# Patient Record
Sex: Female | Born: 1940
Health system: Southern US, Community
[De-identification: ages and names within clinical notes are randomized; demographics above are authoritative.]

## PROBLEM LIST (undated history)

## (undated) DIAGNOSIS — E119 Type 2 diabetes mellitus without complications: Secondary | ICD-10-CM

## (undated) DIAGNOSIS — K219 Gastro-esophageal reflux disease without esophagitis: Secondary | ICD-10-CM

## (undated) DIAGNOSIS — M1990 Unspecified osteoarthritis, unspecified site: Secondary | ICD-10-CM

## (undated) HISTORY — PX: TONSILLECTOMY: SUR1361

## (undated) HISTORY — PX: CATARACT EXTRACTION W/ INTRAOCULAR LENS IMPLANT: SHX1309

## (undated) HISTORY — PX: CHOLECYSTECTOMY: SHX55

## (undated) HISTORY — PX: ABDOMINAL HYSTERECTOMY: SHX81

## (undated) HISTORY — DX: Type 2 diabetes mellitus without complications: E11.9

---

## 2003-11-05 HISTORY — PX: COLONOSCOPY: SHX174

## 2003-12-19 ENCOUNTER — Ambulatory Visit (HOSPITAL_COMMUNITY): Admission: RE | Admit: 2003-12-19 | Discharge: 2003-12-19 | Payer: Self-pay | Admitting: Internal Medicine

## 2004-08-07 ENCOUNTER — Emergency Department (HOSPITAL_COMMUNITY): Admission: EM | Admit: 2004-08-07 | Discharge: 2004-08-07 | Payer: Self-pay | Admitting: Emergency Medicine

## 2006-07-25 ENCOUNTER — Ambulatory Visit (HOSPITAL_COMMUNITY): Admission: RE | Admit: 2006-07-25 | Discharge: 2006-07-25 | Payer: Self-pay | Admitting: Internal Medicine

## 2010-01-19 DIAGNOSIS — Z860100 Personal history of colon polyps, unspecified: Secondary | ICD-10-CM | POA: Insufficient documentation

## 2010-02-07 ENCOUNTER — Ambulatory Visit: Payer: Self-pay | Admitting: Internal Medicine

## 2010-02-07 DIAGNOSIS — K219 Gastro-esophageal reflux disease without esophagitis: Secondary | ICD-10-CM

## 2010-02-07 DIAGNOSIS — R1012 Left upper quadrant pain: Secondary | ICD-10-CM

## 2010-02-08 ENCOUNTER — Ambulatory Visit (HOSPITAL_COMMUNITY): Admission: RE | Admit: 2010-02-08 | Discharge: 2010-02-08 | Payer: Self-pay | Admitting: Family Medicine

## 2010-12-04 NOTE — Assessment & Plan Note (Signed)
Summary: e30,hx of polypsgu   Visit Type:  f/u Primary Care Provider:  [facility]  Chief Complaint:  needs tcs and hx of polyps.  History of Present Illness: Danielle Joseph is a pleasant 70 y/o WF who presents to schedule surveillance colonoscopy given h/o colonic polyps. She has had multiple colonscopies previously and has had numerous polyps. Dr. Jena Gauss performed last TCS in 2/05. She had left sided diverticula with long redundant colon, internal hemorroids, but no recurrent polyps. She has chronic intermittent diarrhea since her cholecystectomy. She self-treats with bran/fiber. Denies melena, brbpr. She has infrequent heartburn and takes ginger root for this. Denies dysphagia. She has occasional mild vague pain in the luq without obvious modifying or alleviating factors. She has had remoted EGD and says she has a hiatal hernia.  She has gained 10-15 pounds this winter.     Current Medications (verified): 1)  Fish Oil 1000 Mg Caps (Omega-3 Fatty Acids) .... Once Daily 2)  Glucosamine 500 Mg Caps (Glucosamine Sulfate) .... Once Daily 3)  Ginkoba 40 Mg Tabs (Ginkgo Biloba) .... Once Daily 4)  Odorless Garlic 500 Mg Tabs (Garlic) .... Once Daily 5)  B Complex .... Once Daily 6)  Folic Acid .... Once Daily 7)  Billberry .... Once Daily 8)  Green Tea .... Prn 9)  Ginger Root .... Once Daily  Allergies (verified): 1)  ! Penicillin  Past History:  Past Medical History: TCS, 2/05 --> Anal papilla and internal hemorrhoids otherwise normal rectum. Left sided diverticula with long capacious redundant colon. Diffusely pigmented colonic mucosa, left sided diverticula, no evidence for recurrent polyp or neoplasm.  Past Surgical History: Cholecystectomy Hysterectomy, partial Tonsillectomy  Family History: Maternal uncle, colon cancer.  No FH chronic GI illnesses, liver disease.  Social History: Divorced. 3 children. Never smoked. No alcohol, no drugs. Retired,  Designer, fashion/clothing.  Review of Systems General:  Denies fever, chills, sweats, anorexia, fatigue, weakness, malaise, and weight loss. Eyes:  Denies vision loss. ENT:  Denies nasal congestion, sore throat, hoarseness, and difficulty swallowing. CV:  Denies chest pains, angina, palpitations, dyspnea on exertion, and peripheral edema. Resp:  Denies dyspnea at rest, dyspnea with exercise, and cough. GI:  See HPI. GU:  Denies urinary burning and blood in urine. MS:  Denies joint pain / LOM. Derm:  Denies rash and itching. Neuro:  Denies weakness, paralysis, abnormal sensation, frequent headaches, difficulty walking, memory loss, and confusion. Psych:  Denies depression and anxiety. Endo:  Denies unusual weight change. Heme:  Denies bruising and bleeding. Allergy:  Denies hives and rash.  Vital Signs:  Patient profile:   70 year old female Height:      65 inches Weight:      203 pounds BMI:     33.90 Temp:     98.4 degrees F oral Pulse rate:   64 / minute BP sitting:   134 / 80  (left arm) Cuff size:   regular  Vitals Entered By: Hendricks Limes LPN (February 07, 453 10:52 AM)  Physical Exam  General:  Well developed, well nourished, no acute distress. Head:  Normocephalic and atraumatic. Eyes:  Conjunctivae pink, no scleral icterus.  Mouth:  Oropharyngeal mucosa moist, pink.  No lesions, erythema or exudate.    Neck:  Supple; no masses or thyromegaly. Lungs:  Clear throughout to auscultation. Heart:  Regular rate and rhythm; no murmurs, rubs,  or bruits. Abdomen:  Obese. Positive bowel sounds. NT. No HSM or masses. No abd bruit or hernia. Rectal:  deferred until time of colonoscopy.   Extremities:  No clubbing, cyanosis, edema or deformities noted. Neurologic:  Alert and  oriented x4;  grossly normal neurologically. Skin:  Intact without significant lesions or rashes. Cervical Nodes:  No significant cervical adenopathy. Psych:  Alert and cooperative. Normal mood and affect.  Impression &  Recommendations:  Problem # 1:  COLONIC POLYPS, HX OF (ICD-V12.72)  H/O colonic polyps on prior colonoscopies. Last one clear. FH of CRC, maternal unlce but no first degree relatives. Due for surveillance TCS. Colonoscopy to be performed in near future.  Risks, alternatives, and benefits including but not limited to the risk of reaction to medication, bleeding, infection, and perforation were addressed.  Patient voiced understanding and provided verbal consent.   Orders: Est. Patient Level IV (16109)  Problem # 2:  LUQ PAIN (ICD-789.02)  Very mild, nonspecific pain. Advised if became more persistent she should consider CT A/P with IV/oral contrast. She is not interested at this time. Also, with periodic GERD, offered her PPI and/or EGD but again she is not interested at this time. She will call with any further problems.  Orders: Est. Patient Level IV (60454)

## 2010-12-04 NOTE — Letter (Signed)
Summary: TCS ORDER  TCS ORDER   Imported By: Ave Filter 02/07/2010 14:34:47  _____________________________________________________________________  External Attachment:    Type:   Image     Comment:   External Document

## 2011-03-22 NOTE — Op Note (Signed)
NAME:  Danielle Joseph, Danielle Joseph                          ACCOUNT NO.:  [id number]   MEDICAL RECORD NO.:  [id number]                   PATIENT TYPE:  AMB   LOCATION:  DAY                                  FACILITY:  [facility]   PHYSICIAN:  R. Roetta Sessions, M.D.              DATE OF BIRTH:  1941/02/08   DATE OF PROCEDURE:  12/19/2003  DATE OF DISCHARGE:                                 OPERATIVE REPORT   PROCEDURE:  Surveillance colonoscopy.   INDICATIONS FOR PROCEDURE:  The patient is a 70 year old lady devoid of any  lower GI tract symptoms who has a history of colonic polyps with a last  colonoscopy some 7-8 years ago down in [place]. She does not recall  whether or not she had a polyp at that time. She is here for a surveillance  colonoscopy. This approach has been discussed with the patient the length.  The potential risks, benefits, and alternatives have been reviewed,  questions answered.  She is agreeable.  Please see the documentation in  medical records and my handwritten H&P.   MONITORING:  O2 saturation, blood pressure, pulse and respirations were  monitored throughout the entire procedure.   CONSCIOUS SEDATION:  Versed 4 mg IV, Demerol 50 mg IV in divided doses.   INSTRUMENT:  Olympus videochip pediatric colonoscope.   FINDINGS:  Digital rectal exam revealed no abnormalities.   ENDOSCOPIC FINDINGS:  The prep was adequate.   RECTUM:  Examination of the rectal mucosa including retroflexed view of the  anal verge revealed a couple of anal papilla and internal hemorrhoids  otherwise the rectal mucosa appeared normal.   COLON:  The colonic mucosa was surveyed from the rectosigmoid junction  through the left transverse right colon to the area of the appendiceal  orifice, ileocecal valve and cecum. These structures were seen and  photographed for the record. From this level, the scope was slowly and  cautiously withdrawn. All previously mentioned mucosal surfaces were again  seen. The  only abnormalities noted were left sided diverticula and diffusely  pigmented mucosa consistent with melanosis coli.  The colon was long,  redundant and capacious.  The patient tolerated the procedure well and was  reacted in endoscopy.   IMPRESSION:  1. Anal papilla and internal hemorrhoids otherwise normal rectum.  2. Left sided diverticula with long capacious redundant colon.  3. Diffusely pigmented colonic mucosa, left sided diverticula, no evidence     for recurrent polyp or neoplasm.   RECOMMENDATIONS:  1. Repeat colonoscopy in five years.  2. Diverticulosis literature provided to [name].      ___________________________________________                                            Jonathon Bellows, M.D.   RMR/MEDQ  D:  12/19/2003  T:  12/19/2003  Job:  [id number]   cc:   Patrica Duel, M.D.  [address]  [place]  [place] [address]  Fax: [phone]

## 2013-01-27 ENCOUNTER — Emergency Department (HOSPITAL_COMMUNITY): Payer: PRIVATE HEALTH INSURANCE

## 2013-01-27 ENCOUNTER — Encounter (HOSPITAL_COMMUNITY): Payer: Self-pay | Admitting: *Deleted

## 2013-01-27 ENCOUNTER — Emergency Department (HOSPITAL_COMMUNITY)
Admission: EM | Admit: 2013-01-27 | Discharge: 2013-01-27 | Disposition: A | Discharge status: Home / Self Care | Payer: PRIVATE HEALTH INSURANCE | Attending: Emergency Medicine | Admitting: Emergency Medicine

## 2013-01-27 DIAGNOSIS — W208XXA Other cause of strike by thrown, projected or falling object, initial encounter: Secondary | ICD-10-CM | POA: Insufficient documentation

## 2013-01-27 DIAGNOSIS — Y9389 Activity, other specified: Secondary | ICD-10-CM | POA: Insufficient documentation

## 2013-01-27 DIAGNOSIS — Y929 Unspecified place or not applicable: Secondary | ICD-10-CM | POA: Insufficient documentation

## 2013-01-27 DIAGNOSIS — S91109A Unspecified open wound of unspecified toe(s) without damage to nail, initial encounter: Secondary | ICD-10-CM | POA: Insufficient documentation

## 2013-01-27 DIAGNOSIS — Z23 Encounter for immunization: Secondary | ICD-10-CM | POA: Insufficient documentation

## 2013-01-27 DIAGNOSIS — S91209A Unspecified open wound of unspecified toe(s) with damage to nail, initial encounter: Secondary | ICD-10-CM

## 2013-01-27 MED ORDER — TETANUS-DIPHTH-ACELL PERTUSSIS 5-2.5-18.5 LF-MCG/0.5 IM SUSP
0.5000 mL | Freq: Once | INTRAMUSCULAR | Status: AC
  Administered 2013-01-27: 0.5 mL via INTRAMUSCULAR
  Filled 2013-01-27: qty 0.5

## 2013-01-27 MED ORDER — HYDROCODONE-ACETAMINOPHEN 5-325 MG PO TABS: 1.0000 | ORAL_TABLET | ORAL | Status: DC | PRN

## 2013-01-27 MED ORDER — LIDOCAINE HCL (PF) 2 % IJ SOLN
INTRAMUSCULAR | Status: AC
  Administered 2013-01-27: 20:00:00
  Filled 2013-01-27: qty 10

## 2013-01-27 NOTE — ED Notes (Signed)
feft great toe injury, dropped a piece of wood on foot

## 2013-01-27 NOTE — ED Notes (Addendum)
Pt c/o left great toe pain that began this afternoon after dropping a piece of wood on the toe. Toenail is hanging off of toe. Bleeding controlled. Pt is able to move toe.

## 2013-02-01 ENCOUNTER — Encounter: Payer: Self-pay | Admitting: Orthopedic Surgery

## 2013-02-01 ENCOUNTER — Ambulatory Visit (INDEPENDENT_AMBULATORY_CARE_PROVIDER_SITE_OTHER): Payer: PRIVATE HEALTH INSURANCE | Admitting: Orthopedic Surgery

## 2013-02-01 VITALS — BP 132/84 | Ht 65.0 in | Wt 180.0 lb

## 2013-02-01 DIAGNOSIS — S91109A Unspecified open wound of unspecified toe(s) without damage to nail, initial encounter: Secondary | ICD-10-CM

## 2013-02-01 DIAGNOSIS — S91209A Unspecified open wound of unspecified toe(s) with damage to nail, initial encounter: Secondary | ICD-10-CM

## 2013-02-01 DIAGNOSIS — S91209S Unspecified open wound of unspecified toe(s) with damage to nail, sequela: Secondary | ICD-10-CM

## 2013-02-01 DIAGNOSIS — IMO0002 Reserved for concepts with insufficient information to code with codable children: Secondary | ICD-10-CM

## 2013-02-01 NOTE — Patient Instructions (Addendum)
Keep clean and dry   Come in  In 1 week to change dressing

## 2013-02-01 NOTE — ED Provider Notes (Signed)
History     CSN: [id number]  Arrival date & time 01/27/13  1802   First MD Initiated Contact with Patient 01/27/13 1826      Chief Complaint  Patient presents with  . Toe Injury    (Consider location/radiation/quality/duration/timing/severity/associated sxs/prior treatment) HPI Comments: Danielle Joseph is a 72 y.o. Female presenting with pain, swelling, bleeding and nail avulsion since dropping a piece of wood on her left great toe 2 hours prior to arrival.  Pain is constant,  Throbbing and worse with palpation, better at rest and with elevation of the foot.  She has taken no medications prior to arrival for her pain.  She is unsure of her tetanus status.     The history is provided by the patient.    History reviewed. No pertinent past medical history.  Past Surgical History  Procedure Laterality Date  . Cholecystectomy    . Abdominal hysterectomy    . Tonsillectomy      Family History  Problem Relation Age of Onset  . Heart disease    . Arthritis    . Diabetes      History  Substance Use Topics  . Smoking status: Never Smoker   . Smokeless tobacco: Not on file  . Alcohol Use: No    OB History   Grav Para Term Preterm Abortions TAB SAB Ect Mult Living                  Review of Systems  Constitutional: Negative for fever and chills.  HENT: Negative.  Negative for sore throat.   Eyes: Negative.   Respiratory: Negative for chest tightness.   Cardiovascular: Negative for chest pain.  Gastrointestinal: Negative for nausea and abdominal pain.  Musculoskeletal: Positive for arthralgias.  Skin: Positive for wound. Negative for rash.  Neurological: Negative for dizziness, weakness, light-headedness, numbness and headaches.  Psychiatric/Behavioral: Negative.     Allergies  Penicillins  Home Medications   Current Outpatient Rx  Name  Route  Sig  Dispense  Refill  . HYDROcodone-acetaminophen (NORCO/VICODIN) 5-325 MG per tablet   Oral   Take 1 tablet by  mouth every 4 (four) hours as needed for pain.   20 tablet   0     BP 133/76  Pulse 92  Temp(Src) 98 F (36.7 C) (Oral)  Resp 18  Ht 5\' 5"  (1.651 m)  Wt 180 lb (81.647 kg)  BMI 29.95 kg/m2  SpO2 96%  Physical Exam  Constitutional: She appears well-developed and well-nourished.  HENT:  Head: Atraumatic.  Neck: Normal range of motion.  Cardiovascular:  Pulses equal bilaterally  Musculoskeletal: She exhibits tenderness.  Neurological: She is alert. She has normal strength. She displays normal reflexes. No sensory deficit.  Equal strength  Skin: Skin is warm and dry.  Left great toe nail plate is attached along medial cuticle only. The nail bed is hemostatic.  Less than 3 sec cap refill.  Patient with normal sensation distal toe.  Modest edema present.  Psychiatric: She has a normal mood and affect.    ED Course  NAIL REMOVAL Date/Time: 01/27/2013 7:50 PM Performed by: Burgess Amor Authorized by: Burgess Amor Consent: Verbal consent obtained. Risks and benefits: risks, benefits and alternatives were discussed Consent given by: patient Patient identity confirmed: verbally with patient Time out: Immediately prior to procedure a "time out" was called to verify the correct patient, procedure, equipment, support staff and site/side marked as required. Location: left foot Location details: left big toe Anesthesia: digital  block Local anesthetic: lidocaine 2% without epinephrine Anesthetic total: 2 ml Preparation: skin prepped with Betadine Amount removed: complete Nail bed sutured: yes Suture material: 4-0 Chromic gut Number of sutures: 2 Removed nail replaced and anchored: no Dressing: Xeroform gauze and dressing applied Patient tolerance: Patient tolerated the procedure well with no immediate complications. Comments: Nail bed laceration irregular,  Was able to approximate with lateral tacking sutures.   (including critical care time)  Labs Reviewed - No data to  display No results found.   1. Toenail avulsion, initial encounter       MDM  Patients labs and/or radiological studies were viewed and considered during the medical decision making and disposition process.  Tetanus updated.  Pt prescribed hydrocodone,  Encouraged ice, elevation for the next several days.  Referral to ortho for recheck of injury this week.  (No ortho coverage today,  So both orthopedists names given).  Pt understands she may have permanent problems as the nail plate grows out if the nail bed does not heal completely and she may need revision by ortho depending on how the wound looks over the next several days. No fracture per xray.       Burgess Amor, PA-C 02/01/13 1757

## 2013-02-01 NOTE — Progress Notes (Signed)
Patient ID: Danielle Joseph, female   DOB: [birth year], 72 y.o.   MRN: [id number] Chief Complaint  Patient presents with  . Toe Injury    Left great toe injury, DOI 01-27-13.    This patient dropped a piece of wood on her left great toe on 01/27/2013 the toe was injured and not fractured x-rays were negative the toe was removed the nailbed was repaired with resorbable suture she comes in for her first visit he complains of some throbbing pain 3/10  She has a history of some heartburn and diarrhea on review of systems along with some seasonal allergies  On exam she is well-developed well-nourished grooming and hygiene are normal BP 132/84  Ht 5\' 5"  (1.651 m)  Wt 180 lb (81.647 kg)  BMI 29.95 kg/m2  The toe looks clean the nailbed looks clean good repair is noted Good capillary refill is noted  Patient is oriented x3 mood is normal ambulation is normal in the foot flop  We redressed the wound and wanted to keep it clean and dry placed in a postop shoe followup in a week change dressing to a Band-Aid as long as there is no infection

## 2013-02-08 NOTE — ED Provider Notes (Signed)
Medical screening examination/treatment/procedure(s) were performed by non-physician practitioner and as supervising physician I was immediately available for consultation/collaboration.  Donnetta Hutching, MD 02/08/13 1208

## 2013-02-09 ENCOUNTER — Ambulatory Visit: Payer: PRIVATE HEALTH INSURANCE | Admitting: Orthopedic Surgery

## 2013-02-11 ENCOUNTER — Ambulatory Visit (INDEPENDENT_AMBULATORY_CARE_PROVIDER_SITE_OTHER): Payer: PRIVATE HEALTH INSURANCE | Admitting: Orthopedic Surgery

## 2013-02-11 ENCOUNTER — Encounter: Payer: Self-pay | Admitting: Orthopedic Surgery

## 2013-02-11 VITALS — BP 140/82 | Ht 65.0 in | Wt 180.0 lb

## 2013-02-11 DIAGNOSIS — Z5189 Encounter for other specified aftercare: Secondary | ICD-10-CM

## 2013-02-11 DIAGNOSIS — S91209D Unspecified open wound of unspecified toe(s) with damage to nail, subsequent encounter: Secondary | ICD-10-CM

## 2013-02-11 NOTE — Progress Notes (Signed)
Patient ID: Danielle Joseph, female   DOB: 1941-04-18, 72 y.o.   MRN: [id number] Chief Complaint  Patient presents with  . Follow-up    dressing change left great toe DOI 01/27/13    Left great toenail avulsion now repaired in ER with nonabsorbable suture dressing change today after Xeroform dressing for the last week toenail bed looks clean repair looks good and recommend dressing change for a few more days then allow the nail to grow back. She is concerned about her $30 co-pay so onset of a 3 month followup she will come back if things aren't going well

## 2013-02-11 NOTE — Patient Instructions (Signed)
Remove band aid Monday You can take a shower now

## 2015-01-30 IMAGING — CR DG FOOT COMPLETE 3+V*L*
3 series · 3 of 3 positions shown · non-contrast
Comparison: None

CLINICAL DATA: Injured left foot.

LEFT FOOT - COMPLETE 3+ VIEW

[view not recorded (1 of 3)]
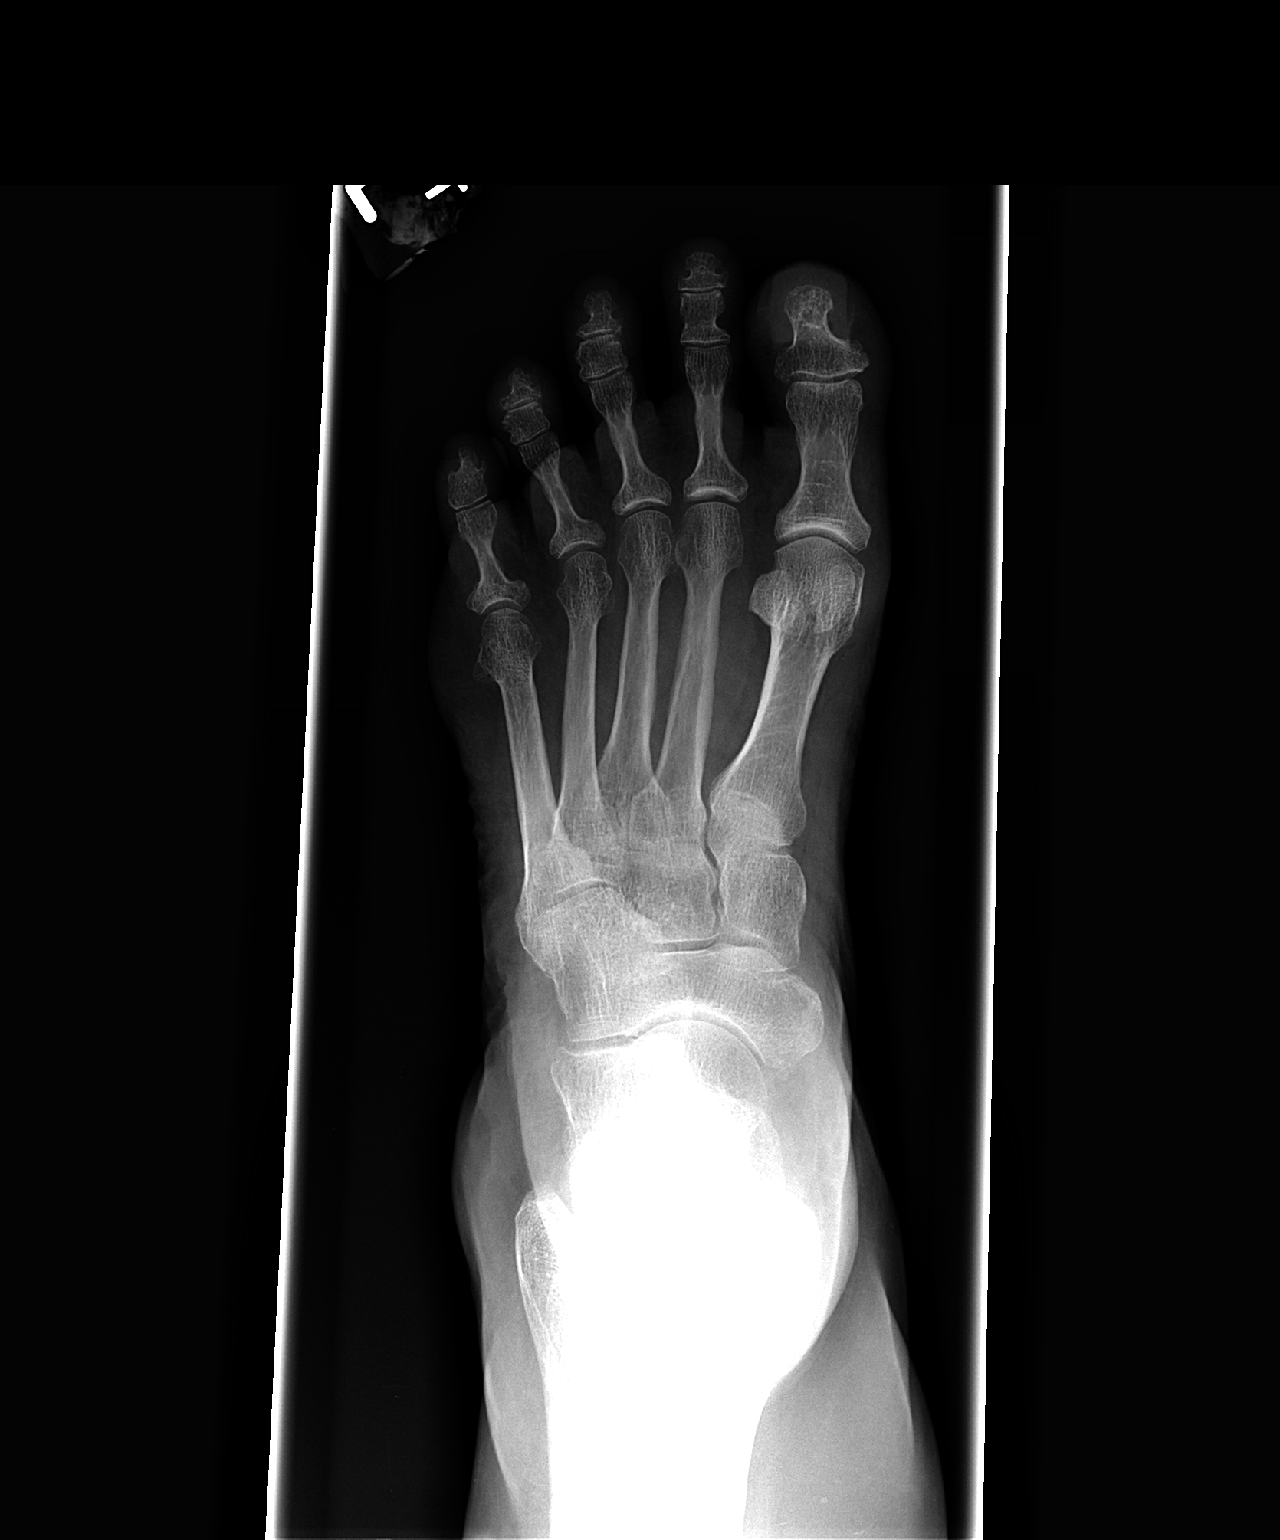

[view not recorded (2 of 3)]
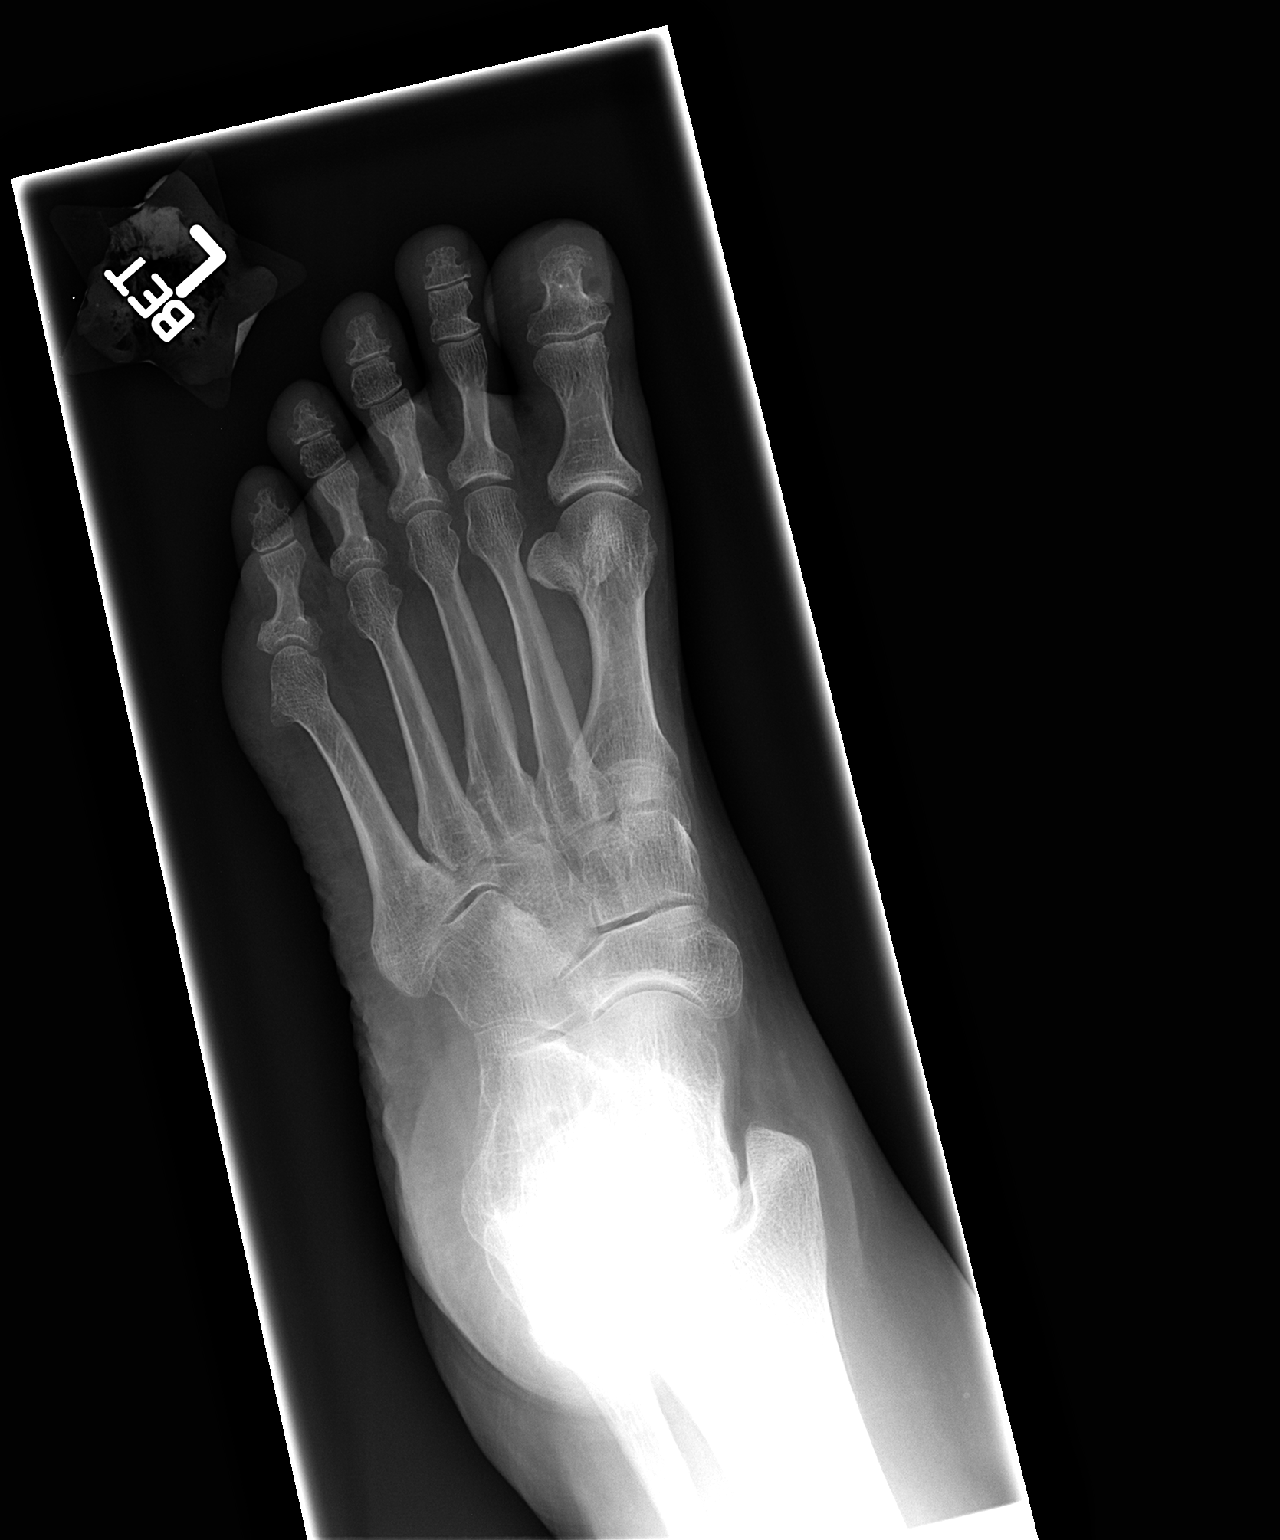

[view not recorded (3 of 3)]
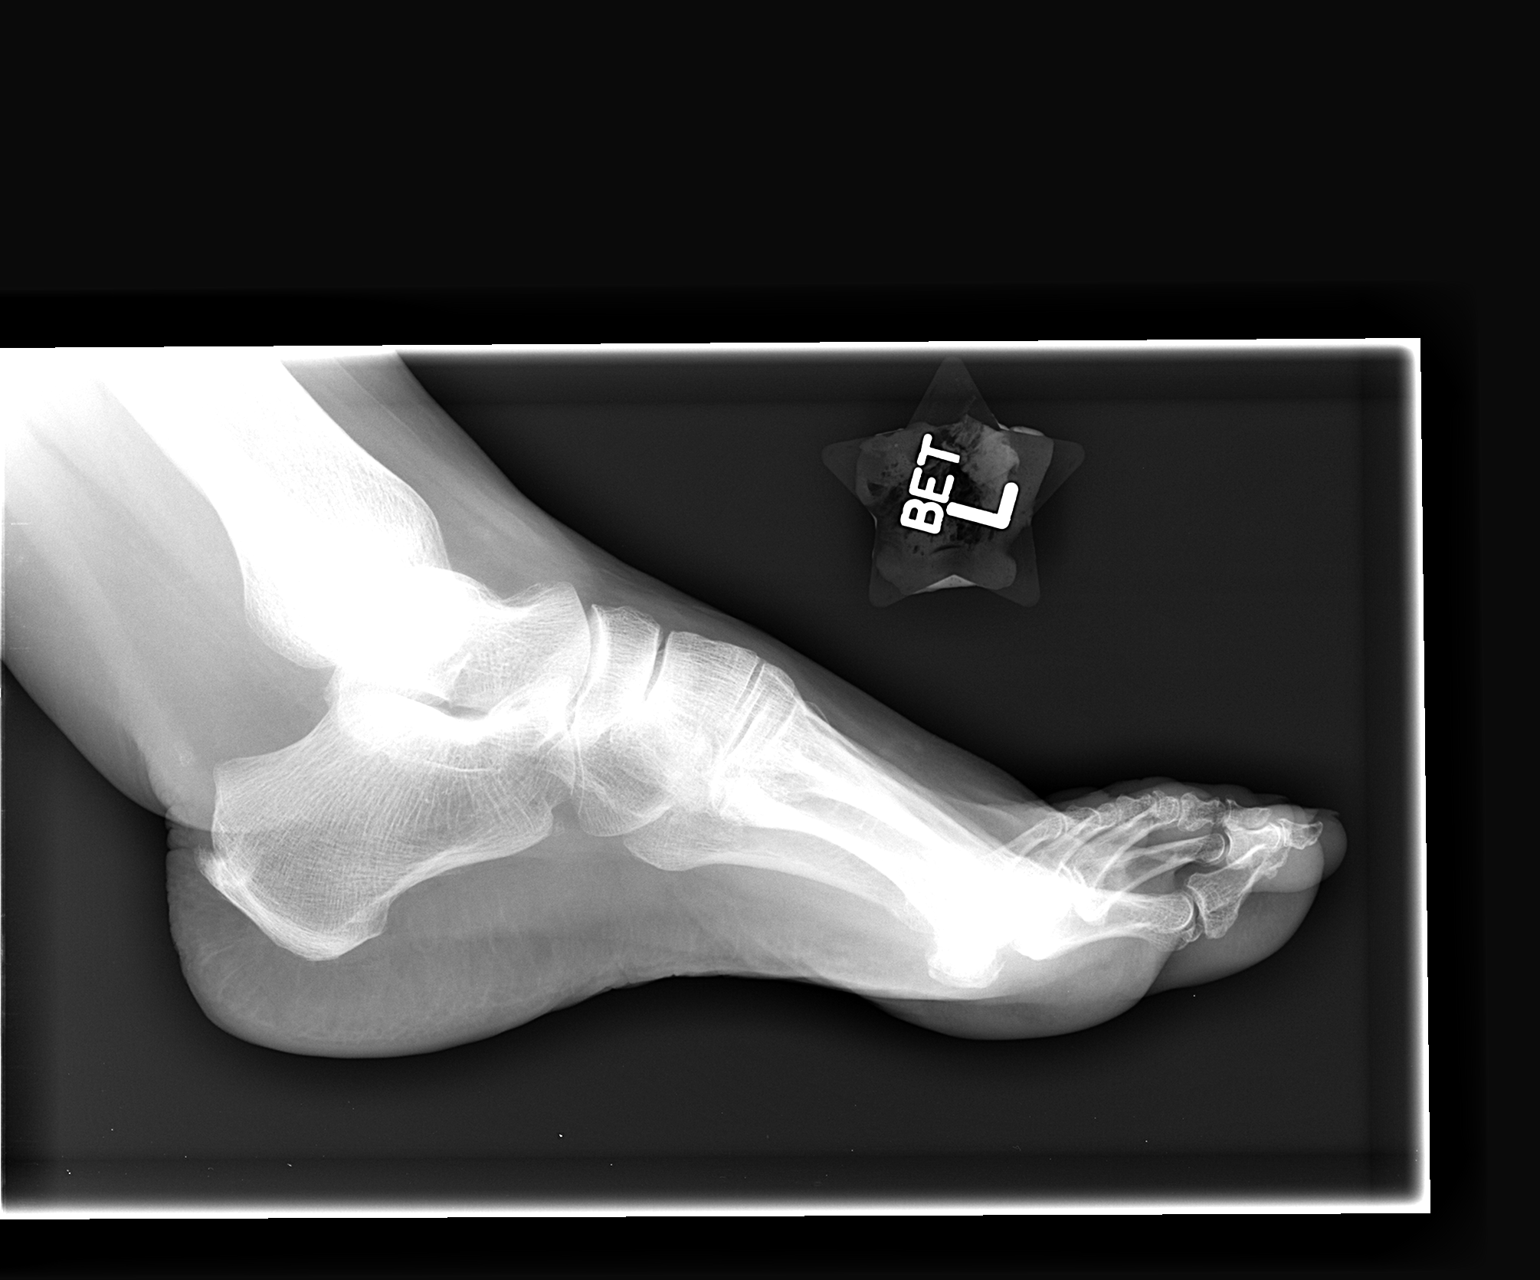

[3 of 3 positions shown; findings below may reference images not displayed]

FINDINGS: The joint spaces are maintained.  No acute fracture.
IMPRESSION: No acute bony findings.

## 2016-07-31 DIAGNOSIS — H40033 Anatomical narrow angle, bilateral: Secondary | ICD-10-CM | POA: Diagnosis not present

## 2016-07-31 DIAGNOSIS — H2513 Age-related nuclear cataract, bilateral: Secondary | ICD-10-CM | POA: Diagnosis not present

## 2016-08-13 DIAGNOSIS — H02831 Dermatochalasis of right upper eyelid: Secondary | ICD-10-CM | POA: Diagnosis not present

## 2016-08-13 DIAGNOSIS — H1851 Endothelial corneal dystrophy: Secondary | ICD-10-CM | POA: Diagnosis not present

## 2016-08-13 DIAGNOSIS — H25812 Combined forms of age-related cataract, left eye: Secondary | ICD-10-CM | POA: Diagnosis not present

## 2016-08-13 DIAGNOSIS — H25811 Combined forms of age-related cataract, right eye: Secondary | ICD-10-CM | POA: Diagnosis not present

## 2016-09-05 DIAGNOSIS — H25811 Combined forms of age-related cataract, right eye: Secondary | ICD-10-CM | POA: Diagnosis not present

## 2016-09-05 DIAGNOSIS — H2511 Age-related nuclear cataract, right eye: Secondary | ICD-10-CM | POA: Diagnosis not present

## 2016-09-06 DIAGNOSIS — H59021 Cataract (lens) fragments in eye following cataract surgery, right eye: Secondary | ICD-10-CM | POA: Diagnosis not present

## 2016-09-06 DIAGNOSIS — H43813 Vitreous degeneration, bilateral: Secondary | ICD-10-CM | POA: Diagnosis not present

## 2016-09-09 DIAGNOSIS — H59021 Cataract (lens) fragments in eye following cataract surgery, right eye: Secondary | ICD-10-CM | POA: Diagnosis not present

## 2017-06-18 ENCOUNTER — Emergency Department (HOSPITAL_COMMUNITY)
Admission: EM | Admit: 2017-06-18 | Discharge: 2017-06-18 | Disposition: A | Discharge status: Home / Self Care | Payer: PPO | Attending: Emergency Medicine | Admitting: Emergency Medicine

## 2017-06-18 ENCOUNTER — Encounter (HOSPITAL_COMMUNITY): Payer: Self-pay | Admitting: Cardiology

## 2017-06-18 DIAGNOSIS — R739 Hyperglycemia, unspecified: Secondary | ICD-10-CM | POA: Diagnosis not present

## 2017-06-18 DIAGNOSIS — E1165 Type 2 diabetes mellitus with hyperglycemia: Secondary | ICD-10-CM | POA: Diagnosis not present

## 2017-06-18 DIAGNOSIS — R42 Dizziness and giddiness: Secondary | ICD-10-CM | POA: Insufficient documentation

## 2017-06-18 LAB — URINALYSIS, ROUTINE W REFLEX MICROSCOPIC
Bilirubin Urine: NEGATIVE
Glucose, UA: >=500 mg/dL — AB
Hgb urine dipstick: NEGATIVE
Ketones, ur: NEGATIVE mg/dL
Nitrite: NEGATIVE
Protein, ur: NEGATIVE mg/dL
Specific Gravity, Urine: 1.028 (ref 1.005–1.030)
pH: 5 (ref 5.0–8.0)

## 2017-06-18 LAB — CBG MONITORING, ED: Glucose-Capillary: 293 mg/dL — ABNORMAL HIGH (ref 65–99)

## 2017-06-18 MED ORDER — MECLIZINE HCL 12.5 MG PO TABS: 12.5000 mg | ORAL_TABLET | Freq: Three times a day (TID) | ORAL | 0 refills | Status: DC | PRN

## 2017-06-18 MED ORDER — MECLIZINE HCL 12.5 MG PO TABS
12.5000 mg | ORAL_TABLET | Freq: Once | ORAL | Status: AC
  Administered 2017-06-18: 12.5 mg via ORAL
  Filled 2017-06-18: qty 1

## 2017-06-18 NOTE — ED Provider Notes (Signed)
[facility]-EMERGENCY DEPT Provider Note   CSN: [id number] Arrival date & time: 06/18/17  1022     History   Chief Complaint Chief Complaint  Patient presents with  . Dizziness    HPI Danielle Joseph is a 76 y.o. female.  HPI Patient presents with dizziness. Began yesterday. Starts with the ringing inner ears no fullness. Began on right side and on both sides. States she's feels unsteady with it. States she's had some slight difficulty hearing with it. No vision changes. Minimal headache but feels more fullness in the ears. No other localizing numbness or weakness. No confusion. No difficulty speaking. Has had some ear problems in the past but no history of vertigo. No vision changes. History reviewed. No pertinent past medical history.  Patient Active Problem List   Diagnosis Date Noted  . GERD 02/07/2010  . LUQ PAIN 02/07/2010  . COLONIC POLYPS, HX OF 01/19/2010    Past Surgical History:  Procedure Laterality Date  . ABDOMINAL HYSTERECTOMY    . CHOLECYSTECTOMY    . TONSILLECTOMY      OB History    No data available       Home Medications    Prior to Admission medications   Medication Sig Start Date End Date Taking? Authorizing Provider  HYDROcodone-acetaminophen (NORCO/VICODIN) 5-325 MG per tablet Take 1 tablet by mouth every 4 (four) hours as needed for pain. 01/27/13   Burgess AmorIdol, Julie, PA-C  meclizine (ANTIVERT) 12.5 MG tablet Take 1 tablet (12.5 mg total) by mouth 3 (three) times daily as needed for dizziness. 06/18/17   Benjiman CorePickering, Dante Cooter, MD    Family History Family History  Problem Relation Age of Onset  . Heart disease Unknown   . Arthritis Unknown   . Diabetes Unknown     Social History Social History  Substance Use Topics  . Smoking status: Never Smoker  . Smokeless tobacco: Not on file  . Alcohol use No     Allergies   Penicillins   Review of Systems Review of Systems  Constitutional: Negative for appetite change.  HENT: Positive for tinnitus.     Respiratory: Negative for chest tightness.   Cardiovascular: Negative for chest pain.  Gastrointestinal: Negative for abdominal pain.  Genitourinary: Negative for dysuria.       Foul-smelling urine  Musculoskeletal: Negative for back pain.  Neurological: Positive for dizziness and headaches.     Physical Exam Updated Vital Signs BP 135/65   Pulse (!) 58   Temp 98 F (36.7 C) (Oral)   Resp 17   SpO2 98%   Physical Exam  Constitutional: She is oriented to person, place, and time. She appears well-developed.  HENT:  Head: Atraumatic.  Eyes:  Eye movements intact. Nystagmus with gaze to left.  Neck: Neck supple.  Cardiovascular: Normal rate.   Pulmonary/Chest: No respiratory distress.  Abdominal: Soft.  Musculoskeletal: She exhibits no edema.  Neurological: She is alert and oriented to person, place, and time.  Finger-nose intact bilaterally. Good grips bilaterally. Face symmetric. Some nystagmus with gaze to left. Heel shin intact bilaterally. Felt a little dizzy going to sitting but was able to stand without dizziness. Also able ambulated.  Skin: Skin is warm. Capillary refill takes less than 2 seconds.  Psychiatric: She has a normal mood and affect.     ED Treatments / Results  Labs (all labs ordered are listed, but only abnormal results are displayed) Labs Reviewed  URINALYSIS, ROUTINE W REFLEX MICROSCOPIC - Abnormal; Notable for the following:  Result Value   APPearance HAZY (*)    Glucose, UA >=500 (*)    Leukocytes, UA MODERATE (*)    Bacteria, UA RARE (*)    Squamous Epithelial / LPF 6-30 (*)    Non Squamous Epithelial 0-5 (*)    All other components within normal limits  CBG MONITORING, ED - Abnormal; Notable for the following:    Glucose-Capillary 293 (*)    All other components within normal limits  URINE CULTURE    EKG  EKG Interpretation  Date/Time:  Wednesday June 18 2017 10:35:22 EDT Ventricular Rate:  64 PR Interval:    QRS  Duration: 109 QT Interval:  421 QTC Calculation: 435 R Axis:   98 Text Interpretation:  Sinus rhythm Consider left atrial enlargement Right axis deviation Probable anteroseptal infarct, old No old tracing to compare Confirmed by Benjiman Core [id number]) on 06/18/2017 10:42:56 AM       Radiology No results found.  Procedures Procedures (including critical care time)  Medications Ordered in ED Medications  meclizine (ANTIVERT) tablet 12.5 mg (12.5 mg Oral Given 06/18/17 1105)     Initial Impression / Assessment and Plan / ED Course  I have reviewed the triage vital signs and the nursing notes.  Pertinent labs & imaging results that were available during my care of the patient were reviewed by me and considered in my medical decision making (see chart for details).     Patient presents with vertigo. Appears to be peripheral. Has tinnitus with it. Nonfocal exam otherwise except for some mild nystagmus. Feels somewhat better after Antivert. I doubt this is a central cause. Mild hyperglycemia. Sugar of under 300. Has "Sugar diabetes" does not take her metformin. Is not willing to take it. No ketones in urine. Has follow-up appointment with her PCP. Will discharge home. Patient was instructed on the need for medical compliance.  Final Clinical Impressions(s) / ED Diagnoses   Final diagnoses:  Vertigo  Hyperglycemia    New Prescriptions New Prescriptions   MECLIZINE (ANTIVERT) 12.5 MG TABLET    Take 1 tablet (12.5 mg total) by mouth 3 (three) times daily as needed for dizziness.     Benjiman Core, MD 06/18/17 1304

## 2017-06-18 NOTE — Discharge Instructions (Signed)
Follow-up through Dr. to get your sugar under control. And further management of your vertigo with needed.

## 2017-06-18 NOTE — ED Triage Notes (Signed)
Dizziness and ear fullness, headache since yesterday morning.

## 2017-06-20 LAB — URINE CULTURE

## 2017-07-10 DIAGNOSIS — R81 Glycosuria: Secondary | ICD-10-CM | POA: Diagnosis not present

## 2017-07-10 DIAGNOSIS — Z6826 Body mass index (BMI) 26.0-26.9, adult: Secondary | ICD-10-CM | POA: Diagnosis not present

## 2017-07-10 DIAGNOSIS — N390 Urinary tract infection, site not specified: Secondary | ICD-10-CM | POA: Diagnosis not present

## 2017-07-10 DIAGNOSIS — H8149 Vertigo of central origin, unspecified ear: Secondary | ICD-10-CM | POA: Diagnosis not present

## 2017-07-11 DIAGNOSIS — I10 Essential (primary) hypertension: Secondary | ICD-10-CM | POA: Diagnosis not present

## 2017-07-11 DIAGNOSIS — E748 Other specified disorders of carbohydrate metabolism: Secondary | ICD-10-CM | POA: Diagnosis not present

## 2017-07-17 DIAGNOSIS — Z6826 Body mass index (BMI) 26.0-26.9, adult: Secondary | ICD-10-CM | POA: Diagnosis not present

## 2017-07-17 DIAGNOSIS — E119 Type 2 diabetes mellitus without complications: Secondary | ICD-10-CM | POA: Diagnosis not present

## 2017-07-30 DIAGNOSIS — H40033 Anatomical narrow angle, bilateral: Secondary | ICD-10-CM | POA: Diagnosis not present

## 2017-07-30 DIAGNOSIS — E119 Type 2 diabetes mellitus without complications: Secondary | ICD-10-CM | POA: Diagnosis not present

## 2017-10-06 DIAGNOSIS — E119 Type 2 diabetes mellitus without complications: Secondary | ICD-10-CM | POA: Diagnosis not present

## 2017-10-06 DIAGNOSIS — E782 Mixed hyperlipidemia: Secondary | ICD-10-CM | POA: Diagnosis not present

## 2017-10-09 DIAGNOSIS — R945 Abnormal results of liver function studies: Secondary | ICD-10-CM | POA: Diagnosis not present

## 2017-10-09 DIAGNOSIS — E119 Type 2 diabetes mellitus without complications: Secondary | ICD-10-CM | POA: Diagnosis not present

## 2017-10-09 DIAGNOSIS — Z86010 Personal history of colonic polyps: Secondary | ICD-10-CM | POA: Diagnosis not present

## 2017-10-09 DIAGNOSIS — Z6826 Body mass index (BMI) 26.0-26.9, adult: Secondary | ICD-10-CM | POA: Diagnosis not present

## 2017-10-09 DIAGNOSIS — E782 Mixed hyperlipidemia: Secondary | ICD-10-CM | POA: Diagnosis not present

## 2017-10-09 DIAGNOSIS — H259 Unspecified age-related cataract: Secondary | ICD-10-CM | POA: Diagnosis not present

## 2017-10-21 ENCOUNTER — Encounter: Payer: Self-pay | Admitting: Internal Medicine

## 2017-12-05 ENCOUNTER — Ambulatory Visit: Payer: PPO | Admitting: Gastroenterology

## 2017-12-05 ENCOUNTER — Encounter: Payer: Self-pay | Admitting: Gastroenterology

## 2017-12-05 ENCOUNTER — Other Ambulatory Visit: Payer: Self-pay

## 2017-12-05 VITALS — BP 125/80 | HR 84 | Temp 96.9°F | Ht 65.0 in | Wt 168.2 lb

## 2017-12-05 DIAGNOSIS — K529 Noninfective gastroenteritis and colitis, unspecified: Secondary | ICD-10-CM | POA: Diagnosis not present

## 2017-12-05 DIAGNOSIS — Z86010 Personal history of colonic polyps: Secondary | ICD-10-CM | POA: Diagnosis not present

## 2017-12-05 MED ORDER — PEG 3350-KCL-NA BICARB-NACL 420 G PO SOLR: 4000.0000 mL | ORAL | 0 refills | Status: DC

## 2017-12-05 NOTE — Patient Instructions (Addendum)
We have scheduled you for a colonoscopy with Dr. Jena Gauss in the near future.  Since you are dealing with alternating bowel habits, I recommend keeping a food/stool diary to see what foods "trigger" you. I would avoid milk products for now. See what happens when you add a small dose of fiber daily like benefiber or metamucil.    Lactose-Free Diet, Adult If you have lactose intolerance, you are not able to digest lactose. Lactose is a natural sugar found mainly in milk and milk products. You may need to avoid all foods and beverages that contain lactose. A lactose-free diet can help you do this. What do I need to know about this diet?  Do not consume foods, beverages, vitamins, minerals, or medicines with lactose. Read ingredients lists carefully.  Look for the words "lactose-free" on labels.  Use lactase enzyme drops or tablets as directed by your health care provider.  Use lactose-free milk or a milk alternative, such as soy milk, for drinking and cooking.  Make sure you get enough calcium and vitamin D in your diet. A lactose-free eating plan can be lacking in these important nutrients.  Take calcium and vitamin D supplements as directed by your health care provider. Talk to your provider about supplements if you are not able to get enough calcium and vitamin D from food. Which foods have lactose? Lactose is found in:  Milk and foods made from milk.  Yogurt.  Cheese.  Butter.  Margarine.  Sour cream.  Cream.  Whipped toppings and nondairy creamers.  Ice cream and other milk-based desserts.  Lactose is also found in foods or products made with milk or milk ingredients. To find out whether a food contains milk or a milk ingredient, look at the ingredients list. Avoid foods with the statement "May contain milk" and foods that contain:  Butter.  Cream.  Milk.  Milk solids.  Milk powder.  Whey.  Curd.  Caseinate.  Lactose.  Lactalbumin.  Lactoglobulin.  What  are some alternatives to milk and foods made with milk products?  Lactose-free milk.  Soy milk with added calcium and vitamin D.  Almond, coconut, or rice milk with added calcium and vitamin D. Note that these are low in protein.  Soy products, such as soy yogurt, soy cheese, soy ice cream, and soy-based sour cream. Which foods can I eat? Grains Breads and rolls made without milk, such as [place], [place], or [place] bread, bagels, pita, and [employer]. Corn tortillas, corn meal, grits, and polenta. Crackers without lactose or milk solids, such as soda crackers and graham crackers. Cooked or dry cereals without lactose or milk solids. Pasta, quinoa, couscous, barley, oats, bulgur, farro, rice, wild rice, or other grains prepared without milk or lactose. Plain popcorn. Vegetables Fresh, frozen, and canned vegetables without cheese, cream, or butter sauces. Fruits All fresh, canned, frozen, or dried fruits that are not processed with lactose. Meats and Other Protein Sources Plain beef, chicken, fish, [place], lamb, veal, pork, wild game, or ham. Kosher-prepared meat products. Strained or junior meats that do not contain milk. Eggs. Soy meat substitutes. Beans, lentils, and hummus. Tofu. Nuts and seeds. Peanut or other nut butters without lactose. Soups, casseroles, and mixed dishes without cheese, cream, or milk. Dairy Lactose-free milk. Soy, rice, or almond milk with added calcium and vitamin D. Soy cheese and yogurt. Beverages Carbonated drinks. Tea. Coffee, freeze-dried coffee, and some instant coffees. Fruit and vegetable juices. Condiments Soy sauce. Carob powder. Olives. Gravy made with water. Baker's cocoa.  Rosita FirePickles. Pure seasonings and spices. Ketchup. Mustard. Bouillon. Broth. Sweets and Desserts Water and fruit ices. Gelatin. Cookies, pies, or cakes made from allowed ingredients, such as angel food cake. Pudding made with water or a milk substitute. Lactose-free tofu desserts. Soy, coconut  milk, or rice-milk-based frozen desserts. Sugar. Honey. Jam, jelly, and marmalade. Molasses. Pure sugar candy. Dark chocolate without milk. Marshmallows. Fats and Oils Margarines and salad dressings that do not contain milk. Tomasa BlaseBacon. Vegetable oils. Shortening. Mayonnaise. Soy or coconut-based cream. The items listed above may not be a complete list of recommended foods or beverages. Contact your dietitian for more options. Which foods are not recommended? Grains Breads and rolls that contain milk. Toaster pastries. Muffins, biscuits, waffles, cornbread, and pancakes. These can be prepared at home, commercial, or from mixes. Sweet rolls, donuts, English muffins, fry bread, lefse, flour tortillas with lactose, or [place] toast made with milk or milk ingredients. Crackers that contain lactose. Corn curls. Cooked or dry cereals with lactose. Vegetables Creamed or breaded vegetables. Vegetables in a cheese or butter sauce or with lactose-containing margarines. Instant potatoes. [place] fries. Scalloped or au gratin potatoes. Fruits None. Meats and Other Protein Sources Scrambled eggs, omelets, and souffles that contain milk. Creamed or breaded meat, fish, chicken, or [place]. Sausage products, such as wieners and liver sausage. Cold cuts that contain milk solids. Cheese, cottage cheese, ricotta cheese, and cheese spreads. Lasagna and macaroni and cheese. Pizza. Peanut or other nut butters with added milk solids. Casseroles or mixed dishes containing milk or cheese. Dairy All dairy products, including milk, goat's milk, buttermilk, kefir, acidophilus milk, flavored milk, evaporated milk, condensed milk, dulce de [place], eggnog, yogurt, cheese, and cheese spreads. Beverages Hot chocolate. Cocoa with lactose. Instant iced teas. Powdered fruit drinks. Smoothies made with milk or yogurt. Condiments Chewing gum that has lactose. Cocoa that has lactose. Spice blends if they contain milk products. Artificial  sweeteners that contain lactose. Nondairy creamers. Sweets and Desserts Ice cream, ice milk, gelato, sherbet, and frozen yogurt. Custard, pudding, and mousse. Cake, cream pies, cookies, and other desserts containing milk, cream, cream cheese, or milk chocolate. Pie crust made with milk-containing margarine or butter. Reduced-calorie desserts made with a sugar substitute that contains lactose. Toffee and butterscotch. Milk, white, or dark chocolate that contains milk. Fudge. Caramel. Fats and Oils Margarines and salad dressings that contain milk or cheese. Cream. Half and half. Cream cheese. Sour cream. Chip dips made with sour cream or yogurt. The items listed above may not be a complete list of foods and beverages to avoid. Contact your dietitian for more information. Am I getting enough calcium? Calcium is found in many foods that contain lactose and is important for bone health. The amount of calcium you need depends on your age:  Adults younger than 50 years: 1000 mg of calcium a day.  Adults older than 50 years: 1200 mg of calcium a day.  If you are not getting enough calcium, other calcium sources include:  Orange juice with calcium added. There are 300-350 mg of calcium in 1 cup of orange juice.  Sardines with edible bones. There are 325 mg of calcium in 3 oz of sardines.  Calcium-fortified soy milk. There are 300-400 mg of calcium in 1 cup of calcium-fortified soy milk.  Calcium-fortified rice or almond milk. There are 300 mg of calcium in 1 cup of calcium-fortified rice or almond milk.  Canned salmon with edible bones. There are 180 mg of calcium in 3 oz of canned salmon  with edible bones.  Calcium-fortified breakfast cereals. There are [phone] mg of calcium in calcium-fortified breakfast cereals.  Tofu set with calcium sulfate. There are 250 mg of calcium in  cup of tofu set with calcium sulfate.  Spinach, cooked. There are 145 mg of calcium in  cup of cooked  spinach.  Edamame, cooked. There are 130 mg of calcium in  cup of cooked edamame.  Collard greens, cooked. There are 125 mg of calcium in  cup of cooked collard greens.  Kale, frozen or cooked. There are 90 mg of calcium in  cup of cooked or frozen kale.  Almonds. There are 95 mg of calcium in  cup of almonds.  Broccoli, cooked. There are 60 mg of calcium in 1 cup of cooked broccoli.  This information is not intended to replace advice given to you by your health care provider. Make sure you discuss any questions you have with your health care provider. Document Released: 04/12/2002 Document Revised: 03/28/2016 Document Reviewed: 01/21/2014 Elsevier Interactive Patient Education  2018 [employer].

## 2017-12-05 NOTE — Progress Notes (Signed)
Primary Care Physician:  Benita Stabile, MD Primary Gastroenterologist:  Dr. Jena Gauss   Chief Complaint  Patient presents with  . Diarrhea    better since stopped Metformin  . Constipation    HPI:   Danielle Joseph is a 77 y.o. female presenting today at the request of Dr. Margo Aye secondary to diarrhea. History of multiple colon polyps in the past per her report, with polyps found in [place] in the early 2000s. Last colonoscopy with [facility] in 2005 without polyps, noted left-sided diverticula with long capacious redundant colon, diffusely pigmented colonic mucosa. She had been set up for colonoscopy in 2011 but then developed shingles.   Delightful conversation with patient but is difficult historian. Has had diarrhea in the past so bad that she had incontinence episodes. Had been on metformin for about 6-7 months and noticed worsening of diarrhea. Stopped metformin and diarrhea decreased in frequency. She thinks diarrhea episodes are linked to "life and my nerves". Thinks what she eats also contributes. Some foods will bother her one time and the next it is fine. States she doesn't pay attention to how often she has diarrhea. Will occasionally have urges to have BMs, but she will not keep track of how often she is going to the bathroom. No rectal bleeding. States she is constipated as well at times. Not on fiber every day. Eats a lot of ice cream.   Maternal uncle with history of colon cancer, which worries her.   Past Medical History:  Diagnosis Date  . Diabetes [facility])     Past Surgical History:  Procedure Laterality Date  . ABDOMINAL HYSTERECTOMY    . CHOLECYSTECTOMY    . COLONOSCOPY  2005   Dr. Jena Gauss: no polyps, noted to have left-sided diverticula with long capacious redundant colon, diffusley pigmented colonic mucosa, surveillance was due in 5 years due to personal history of polyps  . TONSILLECTOMY      Current Outpatient Medications  Medication Sig Dispense Refill  .  atorvastatin (LIPITOR) 10 MG tablet Take by mouth daily. Pt unsure of dose    . b complex vitamins tablet Take 1 tablet by mouth daily.    . cholecalciferol (VITAMIN D) 400 units TABS tablet Take 400 Units by mouth daily.    Marland Kitchen CRANBERRY CONCENTRATE PO Take by mouth daily. Unsure of dose    . cyanocobalamin 1000 MCG tablet Take 1,000 mcg by mouth daily.    . Insulin Degludec (TRESIBA [place]) Inject 20 Units into the skin at bedtime.    . meclizine (ANTIVERT) 12.5 MG tablet Take 1 tablet (12.5 mg total) by mouth 3 (three) times daily as needed for dizziness. 10 tablet 0   No current facility-administered medications for this visit.     Allergies as of 12/05/2017 - Review Complete 12/05/2017  Allergen Reaction Noted  . Penicillins      Family History  Problem Relation Age of Onset  . Heart disease Unknown   . Arthritis Unknown   . Diabetes Unknown   . Colon cancer Maternal Uncle     Social History   Socioeconomic History  . Marital status: Divorced    Spouse name: Not on file  . Number of children: Not on file  . Years of education: Not on file  . Highest education level: Not on file  Social Needs  . Financial resource strain: Not on file  . Food insecurity - worry: Not on file  . Food insecurity - inability: Not on  file  . Transportation needs - medical: Not on file  . Transportation needs - non-medical: Not on file  Occupational History  . Occupation: retired    Comment: [employer]  Tobacco Use  . Smoking status: Never Smoker  . Smokeless tobacco: Never Used  Substance and Sexual Activity  . Alcohol use: No  . Drug use: No  . Sexual activity: Not on file  Other Topics Concern  . Not on file  Social History Narrative  . Not on file    Review of Systems: Gen: Denies any fever, chills, fatigue, weight loss, lack of appetite.  CV: Denies chest pain, heart palpitations, peripheral edema, syncope.  Resp: Denies shortness of breath at rest or with exertion. Denies wheezing or  cough.  GI: see HPI  GU : Denies urinary burning, urinary frequency, urinary hesitancy MS: Denies joint pain, muscle weakness, cramps, or limitation of movement.  Derm: Denies rash, itching, dry skin Psych: Denies depression, anxiety, memory loss, and confusion Heme: Denies bruising, bleeding, and enlarged lymph nodes.  Physical Exam: BP 125/80   Pulse 84   Temp (!) 96.9 F (36.1 C) (Oral)   Ht 5\' 5"  (1.651 m)   Wt 168 lb 3.2 oz (76.3 kg)   BMI 27.99 kg/m  General:   Alert and oriented. Pleasant and cooperative. Well-nourished and well-developed.  Head:  Normocephalic and atraumatic. Eyes:  Without icterus, sclera clear and conjunctiva pink.  Ears:  Normal auditory acuity. Nose:  No deformity, discharge,  or lesions. Mouth:  No deformity or lesions, oral mucosa pink.  Lungs:  Clear to auscultation bilaterally. No wheezes, rales, or rhonchi. No distress.  Heart:  S1, S2 present without murmurs appreciated.  Abdomen:  +BS, soft, non-tender and non-distended. No HSM noted. No guarding or rebound. No masses appreciated.  Rectal:  Deferred  Msk:  Symmetrical without gross deformities. Normal posture. Extremities:  Without edema. Neurologic:  Alert and  oriented x4; Psych:  Alert and cooperative. Normal mood and affect.

## 2017-12-08 NOTE — Assessment & Plan Note (Signed)
Delightful conversant, but she is a difficult historian. Diarrhea improved after metformin discontinued. She has dealt with chronic diarrhea, and from her report seems to be linked to dietary intake. Also notes intermittent constipation. Will have her avoid dairy products, add supplemental fiber if tolerated, and return in May 2019 for routine follow-up. She has no alarm features.

## 2017-12-08 NOTE — Assessment & Plan Note (Signed)
77 year old female with a distant history of colon polyps in the early 2000s, and last colonoscopy here with [facility] in 2005 without polyps. Due for surveillance.   Proceed with TCS with Dr. Jena Gaussourk in near future: the risks, benefits, and alternatives have been discussed with the patient in detail. The patient states understanding and desires to proceed.

## 2017-12-09 NOTE — Progress Notes (Signed)
cc'd to pcp 

## 2017-12-12 DIAGNOSIS — E119 Type 2 diabetes mellitus without complications: Secondary | ICD-10-CM | POA: Diagnosis not present

## 2017-12-12 DIAGNOSIS — E782 Mixed hyperlipidemia: Secondary | ICD-10-CM | POA: Diagnosis not present

## 2018-01-09 DIAGNOSIS — E782 Mixed hyperlipidemia: Secondary | ICD-10-CM | POA: Diagnosis not present

## 2018-01-09 DIAGNOSIS — E119 Type 2 diabetes mellitus without complications: Secondary | ICD-10-CM | POA: Diagnosis not present

## 2018-01-14 ENCOUNTER — Ambulatory Visit (HOSPITAL_COMMUNITY)
Admission: RE | Admit: 2018-01-14 | Discharge: 2018-01-14 | Disposition: A | Discharge status: Home / Self Care | Payer: PPO | Source: Ambulatory Visit | Attending: Internal Medicine | Admitting: Internal Medicine

## 2018-01-14 ENCOUNTER — Encounter (HOSPITAL_COMMUNITY): Payer: Self-pay | Admitting: *Deleted

## 2018-01-14 ENCOUNTER — Other Ambulatory Visit: Payer: Self-pay

## 2018-01-14 ENCOUNTER — Encounter (HOSPITAL_COMMUNITY)
Admission: RE | Disposition: A | Discharge status: Home / Self Care | Payer: Self-pay | Source: Ambulatory Visit | Attending: Internal Medicine

## 2018-01-14 DIAGNOSIS — E119 Type 2 diabetes mellitus without complications: Secondary | ICD-10-CM | POA: Diagnosis not present

## 2018-01-14 DIAGNOSIS — K5730 Diverticulosis of large intestine without perforation or abscess without bleeding: Secondary | ICD-10-CM | POA: Diagnosis not present

## 2018-01-14 DIAGNOSIS — Z79899 Other long term (current) drug therapy: Secondary | ICD-10-CM | POA: Diagnosis not present

## 2018-01-14 DIAGNOSIS — Z860100 Personal history of colon polyps, unspecified: Secondary | ICD-10-CM

## 2018-01-14 DIAGNOSIS — Z1211 Encounter for screening for malignant neoplasm of colon: Secondary | ICD-10-CM | POA: Diagnosis not present

## 2018-01-14 DIAGNOSIS — M1990 Unspecified osteoarthritis, unspecified site: Secondary | ICD-10-CM | POA: Insufficient documentation

## 2018-01-14 DIAGNOSIS — Z800 Family history of malignant neoplasm of digestive organs: Secondary | ICD-10-CM | POA: Insufficient documentation

## 2018-01-14 DIAGNOSIS — Z1212 Encounter for screening for malignant neoplasm of rectum: Secondary | ICD-10-CM

## 2018-01-14 HISTORY — DX: Unspecified osteoarthritis, unspecified site: M19.90

## 2018-01-14 HISTORY — DX: Gastro-esophageal reflux disease without esophagitis: K21.9

## 2018-01-14 HISTORY — PX: COLONOSCOPY: SHX5424

## 2018-01-14 LAB — GLUCOSE, CAPILLARY: Glucose-Capillary: 140 mg/dL — ABNORMAL HIGH (ref 65–99)

## 2018-01-14 SURGERY — COLONOSCOPY
Anesthesia: Moderate Sedation

## 2018-01-14 MED ORDER — MEPERIDINE HCL 100 MG/ML IJ SOLN
INTRAMUSCULAR | Status: AC
  Filled 2018-01-14: qty 1

## 2018-01-14 MED ORDER — ONDANSETRON HCL 4 MG/2ML IJ SOLN
INTRAMUSCULAR | Status: AC
  Filled 2018-01-14: qty 2

## 2018-01-14 MED ORDER — SODIUM CHLORIDE 0.9 % IV SOLN
INTRAVENOUS | Status: DC
  Administered 2018-01-14: 13:00:00 via INTRAVENOUS

## 2018-01-14 MED ORDER — ONDANSETRON HCL 4 MG/2ML IJ SOLN
INTRAMUSCULAR | Status: DC | PRN
Start: 2018-01-14 — End: 2018-01-14
  Administered 2018-01-14: 4 mg via INTRAVENOUS

## 2018-01-14 MED ORDER — MIDAZOLAM HCL 5 MG/5ML IJ SOLN
INTRAMUSCULAR | Status: AC
  Filled 2018-01-14: qty 5

## 2018-01-14 MED ORDER — MEPERIDINE HCL 100 MG/ML IJ SOLN
INTRAMUSCULAR | Status: DC | PRN
Start: 2018-01-14 — End: 2018-01-14
  Administered 2018-01-14 (×2): 25 mg

## 2018-01-14 MED ORDER — MIDAZOLAM HCL 5 MG/5ML IJ SOLN
INTRAMUSCULAR | Status: DC | PRN
  Administered 2018-01-14 (×3): 1 mg via INTRAVENOUS
  Administered 2018-01-14: 2 mg via INTRAVENOUS

## 2018-01-14 NOTE — Discharge Instructions (Signed)
°Colonoscopy °Discharge Instructions ° °Read the instructions outlined below and refer to this sheet in the next few weeks. These discharge instructions provide you with general information on caring for yourself after you leave the hospital. Your doctor may also give you specific instructions. While your treatment has been planned according to the most current medical practices available, unavoidable complications occasionally occur. If you have any problems or questions after discharge, call Dr. Rourk at [phone]. °ACTIVITY °· You may resume your regular activity, but move at a slower pace for the next 24 hours.  °· Take frequent rest periods for the next 24 hours.  °· Walking will help get rid of the air and reduce the bloated feeling in your belly (abdomen).  °· No driving for 24 hours (because of the medicine (anesthesia) used during the test).   °· Do not sign any important legal documents or operate any machinery for 24 hours (because of the anesthesia used during the test).  °NUTRITION °· Drink plenty of fluids.  °· You may resume your normal diet as instructed by your doctor.  °· Begin with a light meal and progress to your normal diet. Heavy or fried foods are harder to digest and may make you feel sick to your stomach (nauseated).  °· Avoid alcoholic beverages for 24 hours or as instructed.  °MEDICATIONS °· You may resume your normal medications unless your doctor tells you otherwise.  °WHAT YOU CAN EXPECT TODAY °· Some feelings of bloating in the abdomen.  °· Passage of more gas than usual.  °· Spotting of blood in your stool or on the toilet paper.  °IF YOU HAD POLYPS REMOVED DURING THE COLONOSCOPY: °· No aspirin products for 7 days or as instructed.  °· No alcohol for 7 days or as instructed.  °· Eat a soft diet for the next 24 hours.  °FINDING OUT THE RESULTS OF YOUR TEST °Not all test results are available during your visit. If your test results are not back during the visit, make an appointment  with your caregiver to find out the results. Do not assume everything is normal if you have not heard from your caregiver or the medical facility. It is important for you to follow up on all of your test results.  °SEEK IMMEDIATE MEDICAL ATTENTION IF: °· You have more than a spotting of blood in your stool.  °· Your belly is swollen (abdominal distention).  °· You are nauseated or vomiting.  °· You have a temperature over 101.  °· You have abdominal pain or discomfort that is severe or gets worse throughout the day.  ° ° °Diverticulosis information provided ° °I do not recommend a future colonoscopy unless new symptoms develop. ° ° °Diverticulosis °Diverticulosis is a condition that develops when small pouches (diverticula) form in the wall of the large intestine (colon). The colon is where water is absorbed and stool is formed. The pouches form when the inside layer of the colon pushes through weak spots in the outer layers of the colon. You may have a few pouches or many of them. °What are the causes? °The cause of this condition is not known. °What increases the risk? °The following factors may make you more likely to develop this condition: °· Being older than age 60. Your risk for this condition increases with age. Diverticulosis is rare among people younger than age 30. By age 80, many people have it. °· Eating a low-fiber diet. °· Having frequent constipation. °· Being overweight. °·   Not getting enough exercise. °· Smoking. °· Taking over-the-counter pain medicines, like aspirin and ibuprofen. °· Having a family history of diverticulosis. ° °What are the signs or symptoms? °In most people, there are no symptoms of this condition. If you do have symptoms, they may include: °· Bloating. °· Cramps in the abdomen. °· Constipation or diarrhea. °· Pain in the lower left side of the abdomen. ° °How is this diagnosed? °This condition is most often diagnosed during an exam for other colon problems. Because  diverticulosis usually has no symptoms, it often cannot be diagnosed independently. This condition may be diagnosed by: °· Using a flexible scope to examine the colon (colonoscopy). °· Taking an X-ray of the colon after dye has been put into the colon (barium enema). °· Doing a CT scan. ° °How is this treated? °You may not need treatment for this condition if you have never developed an infection related to diverticulosis. If you have had an infection before, treatment may include: °· Eating a high-fiber diet. This may include eating more fruits, vegetables, and grains. °· Taking a fiber supplement. °· Taking a live bacteria supplement (probiotic). °· Taking medicine to relax your colon. °· Taking antibiotic medicines. ° °Follow these instructions at home: °· Drink 6-8 glasses of water or more each day to prevent constipation. °· Try not to strain when you have a bowel movement. °· If you have had an infection before: °? Eat more fiber as directed by your health care provider or your diet and nutrition specialist (dietitian). °? Take a fiber supplement or probiotic, if your health care provider approves. °· Take over-the-counter and prescription medicines only as told by your health care provider. °· If you were prescribed an antibiotic, take it as told by your health care provider. Do not stop taking the antibiotic even if you start to feel better. °· Keep all follow-up visits as told by your health care provider. This is important. °Contact a health care provider if: °· You have pain in your abdomen. °· You have bloating. °· You have cramps. °· You have not had a bowel movement in 3 days. °Get help right away if: °· Your pain gets worse. °· Your bloating becomes very bad. °· You have a fever or chills, and your symptoms suddenly get worse. °· You vomit. °· You have bowel movements that are bloody or black. °· You have bleeding from your rectum. °Summary °· Diverticulosis is a condition that develops when small  pouches (diverticula) form in the wall of the large intestine (colon). °· You may have a few pouches or many of them. °· This condition is most often diagnosed during an exam for other colon problems. °· If you have had an infection related to diverticulosis, treatment may include increasing the fiber in your diet, taking supplements, or taking medicines. °This information is not intended to replace advice given to you by your health care provider. Make sure you discuss any questions you have with your health care provider. °Document Released: 07/18/2004 Document Revised: 09/09/2016 Document Reviewed: 09/09/2016 °Elsevier Interactive Patient Education © 2017 [employer] ° °

## 2018-01-14 NOTE — Progress Notes (Signed)
Repeat CT today clearly demonstrates ischemic process involving small bowel with new clot formation. We have the discussed with vascular surgery. I personally discussed with Dr. Arbutus Leasat. Urgent transfer now being arranged for vascular surgery consultation.  Prognosis is guarded.

## 2018-01-14 NOTE — Op Note (Signed)
[facility] Patient Name: Danielle Joseph Procedure Date: 01/14/2018 12:40 PM MRN: [id number] Date of Birth: 12-04-1940 Attending MD: Gennette Pac , MD CSN: [id number] Age: 77 Admit Type: Outpatient Procedure:                Colonoscopy Indications:              Screening for colorectal malignant neoplasm Providers:                Gennette Pac, MD, Jannett Celestine, RN, Burke Keels, Technician Referring MD:              Medicines:                Midazolam mg IV Complications:            No immediate complications. Estimated Blood Loss:     Estimated blood loss: none. Procedure:                Pre-Anesthesia Assessment:                           - Prior to the procedure, a History and Physical                            was performed, and patient medications and                            allergies were reviewed. The patient's tolerance of                            previous anesthesia was also reviewed. The risks                            and benefits of the procedure and the sedation                            options and risks were discussed with the patient.                            All questions were answered, and informed consent                            was obtained. Prior Anticoagulants: The patient has                            taken no previous anticoagulant or antiplatelet                            agents. ASA Grade Assessment: II - A patient with                            mild systemic disease. After reviewing the risks  and benefits, the patient was deemed in                            satisfactory condition to undergo the procedure.                           After obtaining informed consent, the colonoscope                            was passed under direct vision. Throughout the                            procedure, the patient's blood pressure, pulse, and                            oxygen  saturations were monitored continuously. The                            EC-[id number] ([id number] scope was introduced through                            the anus and advanced to the the cecum, identified                            by appendiceal orifice and ileocecal valve. The                            colonoscopy was performed without difficulty. The                            patient tolerated the procedure well. The quality                            of the bowel preparation was adequate. The                            ileocecal valve, appendiceal orifice, and rectum                            were photographed. Scope In: 1:13:51 PM Scope Out: 1:28:34 PM Scope Withdrawal Time: 0 hours 6 minutes 10 seconds  Total Procedure Duration: 0 hours 14 minutes 43 seconds  Findings:      The perianal and digital rectal examinations were normal.      Scattered small and large-mouthed diverticula were found in the sigmoid       colon and descending colon.      The exam was otherwise without abnormality on direct and retroflexion       views. Impression:               - Diverticulosis in the sigmoid colon and in the                            descending colon.                           -  The examination was otherwise normal on direct                            and retroflexion views.                           - No specimens collected. Moderate Sedation:      Moderate (conscious) sedation was administered by the endoscopy nurse       and supervised by the endoscopist. The following parameters were       monitored: oxygen saturation, heart rate, blood pressure, respiratory       rate, EKG, adequacy of pulmonary ventilation, and response to care.       Total physician intraservice time was 21 minutes. Recommendation:           - Patient has a contact number available for                            emergencies. The signs and symptoms of potential                            delayed complications were  discussed with the                            patient. Return to normal activities tomorrow.                            Written discharge instructions were provided to the                            patient.                           - Resume previous diet.                           - Continue present medications.                           - No repeat colonoscopy due to age. Procedure Code(s):        --- Professional ---                           [id number], Colonoscopy, flexible; diagnostic, including                            collection of specimen(s) by brushing or washing,                            when performed (separate procedure)                           99152, Moderate sedation services provided by the                            same physician or other qualified health care  professional performing the diagnostic or                            therapeutic service that the sedation supports,                            requiring the presence of an independent trained                            observer to assist in the monitoring of the                            patient's level of consciousness and physiological                            status; initial 15 minutes of intraservice time,                            patient age 49 years or older Diagnosis Code(s):        --- Professional ---                           Z12.11, Encounter for screening for malignant                            neoplasm of colon                           K57.30, Diverticulosis of large intestine without                            perforation or abscess without bleeding CPT copyright 2016 American Medical Association. All rights reserved. The codes documented in this report are preliminary and upon coder review may  be revised to meet current compliance requirements. Gerrit Friends. Caidence Higashi, MD Gennette Pac, MD 01/14/2018 1:39:45 PM This report has been signed electronically. Number  of Addenda: 0

## 2018-01-14 NOTE — H&P (Signed)
@LOGO @   Primary Care Physician:  Benita Stabile, MD Primary Gastroenterologist:  Dr. Jena Gauss  Pre-Procedure History & Physical: HPI:  Danielle Joseph is a 77 y.o. female is here for a screening colonoscopy.  Here for screening colonoscopy. Distant history of colonic adenoma;  negative TCS 2005. Currently, no bowel symptoms.  Past Medical History:  Diagnosis Date  . Arthritis   . Diabetes (HCC)   . GERD (gastroesophageal reflux disease)     Past Surgical History:  Procedure Laterality Date  . ABDOMINAL HYSTERECTOMY    . CHOLECYSTECTOMY    . COLONOSCOPY  2005   Dr. Jena Gauss: no polyps, noted to have left-sided diverticula with long capacious redundant colon, diffusley pigmented colonic mucosa, surveillance was due in 5 years due to personal history of polyps  . TONSILLECTOMY      Prior to Admission medications   Medication Sig Start Date End Date Taking? Authorizing Provider  atorvastatin (LIPITOR) 10 MG tablet Take 10 mg by mouth daily.    Yes [provider]  b complex vitamins tablet Take 1 tablet by mouth daily.   Yes [provider]  Biotin w/ Vitamins C & E (HAIR/SKIN/NAILS PO) Take 1 tablet by mouth daily.   Yes [provider]  CHOLECALCIFEROL PO Take 1 capsule by mouth daily.    Yes [provider]  CRANBERRY CONCENTRATE PO Take 1 capsule by mouth daily.    Yes [provider]  cyanocobalamin 1000 MCG tablet Take 1,000 mcg by mouth daily.   Yes [provider]  Glycerin-Hypromellose-PEG 400 ([facility] DRY EYE RELIEF OP) Place 1 drop into both eyes daily as needed (for dry eyes).   Yes [provider]  meclizine (ANTIVERT) 12.5 MG tablet Take 1 tablet (12.5 mg total) by mouth 3 (three) times daily as needed for dizziness. 06/18/17  Yes Benjiman Core, MD  polyethylene glycol-electrolytes (TRILYTE) 420 g solution Take 4,000 mLs by mouth as directed. 12/05/17  Yes Ethylene Reznick, Gerrit Friends, MD  TRESIBA FLEXTOUCH 200 UNIT/ML SOPN Inject  20 Units into the skin at bedtime. 10/09/17  Yes [provider]    Allergies as of 12/05/2017 - Review Complete 12/05/2017  Allergen Reaction Noted  . Penicillins      Family History  Problem Relation Age of Onset  . Heart disease Unknown   . Arthritis Unknown   . Diabetes Unknown   . Colon cancer Maternal Uncle     Social History   Socioeconomic History  . Marital status: Divorced    Spouse name: Not on file  . Number of children: Not on file  . Years of education: Not on file  . Highest education level: Not on file  Social Needs  . Financial resource strain: Not on file  . Food insecurity - worry: Not on file  . Food insecurity - inability: Not on file  . Transportation needs - medical: Not on file  . Transportation needs - non-medical: Not on file  Occupational History  . Occupation: retired    Comment: [employer]  Tobacco Use  . Smoking status: Never Smoker  . Smokeless tobacco: Never Used  Substance and Sexual Activity  . Alcohol use: No  . Drug use: No  . Sexual activity: Not on file  Other Topics Concern  . Not on file  Social History Narrative  . Not on file    Review of Systems: See HPI, otherwise negative ROS  Physical Exam: BP 140/84   Pulse 93   Temp 98.7  F (37.1 C) (Oral)   Resp 20   SpO2 100%  General:   Alert,  pleasant and cooperative in NAD Lungs:  Clear throughout to auscultation.   No wheezes, crackles, or rhonchi. No acute distress. Heart:  Regular rate and rhythm; no murmurs, clicks, rubs,  or gallops. Abdomen:  Soft, nontender and nondistended. No masses, hepatosplenomegaly or hernias noted. Normal bowel sounds, without guarding, and without rebound.    Impression/Plan: Danielle Joseph is now here to undergo a screening colonoscopy.    Risks, benefits, limitations, imponderables and alternatives regarding colonoscopy have been reviewed with the patient. Questions have been answered. All parties agreeable.     Notice:  This  dictation was prepared with Dragon dictation along with smaller phrase technology. Any transcriptional errors that result from this process are unintentional and may not be corrected upon review.

## 2018-01-15 DIAGNOSIS — Z86010 Personal history of colonic polyps: Secondary | ICD-10-CM | POA: Diagnosis not present

## 2018-01-15 DIAGNOSIS — E782 Mixed hyperlipidemia: Secondary | ICD-10-CM | POA: Diagnosis not present

## 2018-01-15 DIAGNOSIS — E119 Type 2 diabetes mellitus without complications: Secondary | ICD-10-CM | POA: Diagnosis not present

## 2018-01-15 DIAGNOSIS — Z6828 Body mass index (BMI) 28.0-28.9, adult: Secondary | ICD-10-CM | POA: Diagnosis not present

## 2018-01-15 DIAGNOSIS — R350 Frequency of micturition: Secondary | ICD-10-CM | POA: Diagnosis not present

## 2018-01-15 DIAGNOSIS — R42 Dizziness and giddiness: Secondary | ICD-10-CM | POA: Diagnosis not present

## 2018-01-15 DIAGNOSIS — H269 Unspecified cataract: Secondary | ICD-10-CM | POA: Diagnosis not present

## 2018-01-15 DIAGNOSIS — R945 Abnormal results of liver function studies: Secondary | ICD-10-CM | POA: Diagnosis not present

## 2018-01-19 ENCOUNTER — Encounter (HOSPITAL_COMMUNITY): Payer: Self-pay | Admitting: Internal Medicine

## 2018-03-04 ENCOUNTER — Ambulatory Visit: Payer: PPO | Admitting: Gastroenterology

## 2018-04-15 DIAGNOSIS — E119 Type 2 diabetes mellitus without complications: Secondary | ICD-10-CM | POA: Diagnosis not present

## 2018-04-15 DIAGNOSIS — E782 Mixed hyperlipidemia: Secondary | ICD-10-CM | POA: Diagnosis not present

## 2018-04-17 DIAGNOSIS — E782 Mixed hyperlipidemia: Secondary | ICD-10-CM | POA: Diagnosis not present

## 2018-04-17 DIAGNOSIS — H259 Unspecified age-related cataract: Secondary | ICD-10-CM | POA: Diagnosis not present

## 2018-04-17 DIAGNOSIS — Z6829 Body mass index (BMI) 29.0-29.9, adult: Secondary | ICD-10-CM | POA: Diagnosis not present

## 2018-04-17 DIAGNOSIS — Z86010 Personal history of colonic polyps: Secondary | ICD-10-CM | POA: Diagnosis not present

## 2018-04-17 DIAGNOSIS — R350 Frequency of micturition: Secondary | ICD-10-CM | POA: Diagnosis not present

## 2018-04-17 DIAGNOSIS — R42 Dizziness and giddiness: Secondary | ICD-10-CM | POA: Diagnosis not present

## 2018-04-17 DIAGNOSIS — R945 Abnormal results of liver function studies: Secondary | ICD-10-CM | POA: Diagnosis not present

## 2018-04-17 DIAGNOSIS — E1169 Type 2 diabetes mellitus with other specified complication: Secondary | ICD-10-CM | POA: Diagnosis not present

## 2018-07-20 DIAGNOSIS — E119 Type 2 diabetes mellitus without complications: Secondary | ICD-10-CM | POA: Diagnosis not present

## 2018-07-20 DIAGNOSIS — H259 Unspecified age-related cataract: Secondary | ICD-10-CM | POA: Diagnosis not present

## 2018-07-20 DIAGNOSIS — Z6826 Body mass index (BMI) 26.0-26.9, adult: Secondary | ICD-10-CM | POA: Diagnosis not present

## 2018-07-20 DIAGNOSIS — E1169 Type 2 diabetes mellitus with other specified complication: Secondary | ICD-10-CM | POA: Diagnosis not present

## 2018-07-20 DIAGNOSIS — R42 Dizziness and giddiness: Secondary | ICD-10-CM | POA: Diagnosis not present

## 2018-07-20 DIAGNOSIS — E782 Mixed hyperlipidemia: Secondary | ICD-10-CM | POA: Diagnosis not present

## 2018-07-20 DIAGNOSIS — Z6829 Body mass index (BMI) 29.0-29.9, adult: Secondary | ICD-10-CM | POA: Diagnosis not present

## 2018-07-20 DIAGNOSIS — Z6828 Body mass index (BMI) 28.0-28.9, adult: Secondary | ICD-10-CM | POA: Diagnosis not present

## 2018-07-20 DIAGNOSIS — R350 Frequency of micturition: Secondary | ICD-10-CM | POA: Diagnosis not present

## 2018-07-20 DIAGNOSIS — Z86010 Personal history of colonic polyps: Secondary | ICD-10-CM | POA: Diagnosis not present

## 2018-07-20 DIAGNOSIS — H269 Unspecified cataract: Secondary | ICD-10-CM | POA: Diagnosis not present

## 2018-07-20 DIAGNOSIS — R945 Abnormal results of liver function studies: Secondary | ICD-10-CM | POA: Diagnosis not present

## 2018-07-23 DIAGNOSIS — E1165 Type 2 diabetes mellitus with hyperglycemia: Secondary | ICD-10-CM | POA: Diagnosis not present

## 2018-07-23 DIAGNOSIS — Z86010 Personal history of colonic polyps: Secondary | ICD-10-CM | POA: Diagnosis not present

## 2018-07-23 DIAGNOSIS — H269 Unspecified cataract: Secondary | ICD-10-CM | POA: Diagnosis not present

## 2018-07-23 DIAGNOSIS — R945 Abnormal results of liver function studies: Secondary | ICD-10-CM | POA: Diagnosis not present

## 2018-07-23 DIAGNOSIS — E782 Mixed hyperlipidemia: Secondary | ICD-10-CM | POA: Diagnosis not present

## 2018-07-23 DIAGNOSIS — R079 Chest pain, unspecified: Secondary | ICD-10-CM | POA: Diagnosis not present

## 2018-07-23 DIAGNOSIS — Z6828 Body mass index (BMI) 28.0-28.9, adult: Secondary | ICD-10-CM | POA: Diagnosis not present

## 2018-08-13 DIAGNOSIS — E782 Mixed hyperlipidemia: Secondary | ICD-10-CM | POA: Diagnosis not present

## 2018-08-13 DIAGNOSIS — R945 Abnormal results of liver function studies: Secondary | ICD-10-CM | POA: Diagnosis not present

## 2018-08-13 DIAGNOSIS — E1169 Type 2 diabetes mellitus with other specified complication: Secondary | ICD-10-CM | POA: Diagnosis not present

## 2018-09-23 DIAGNOSIS — Z23 Encounter for immunization: Secondary | ICD-10-CM | POA: Diagnosis not present

## 2018-09-25 DIAGNOSIS — E1169 Type 2 diabetes mellitus with other specified complication: Secondary | ICD-10-CM | POA: Diagnosis not present

## 2018-09-25 DIAGNOSIS — E782 Mixed hyperlipidemia: Secondary | ICD-10-CM | POA: Diagnosis not present

## 2018-10-12 DIAGNOSIS — E782 Mixed hyperlipidemia: Secondary | ICD-10-CM | POA: Diagnosis not present

## 2018-10-12 DIAGNOSIS — E1169 Type 2 diabetes mellitus with other specified complication: Secondary | ICD-10-CM | POA: Diagnosis not present

## 2018-10-30 DIAGNOSIS — E119 Type 2 diabetes mellitus without complications: Secondary | ICD-10-CM | POA: Diagnosis not present

## 2018-10-30 DIAGNOSIS — E1169 Type 2 diabetes mellitus with other specified complication: Secondary | ICD-10-CM | POA: Diagnosis not present

## 2018-10-30 DIAGNOSIS — E1165 Type 2 diabetes mellitus with hyperglycemia: Secondary | ICD-10-CM | POA: Diagnosis not present

## 2018-10-30 DIAGNOSIS — R42 Dizziness and giddiness: Secondary | ICD-10-CM | POA: Diagnosis not present

## 2018-10-30 DIAGNOSIS — E782 Mixed hyperlipidemia: Secondary | ICD-10-CM | POA: Diagnosis not present

## 2018-10-30 DIAGNOSIS — R945 Abnormal results of liver function studies: Secondary | ICD-10-CM | POA: Diagnosis not present

## 2018-11-03 DIAGNOSIS — K219 Gastro-esophageal reflux disease without esophagitis: Secondary | ICD-10-CM | POA: Diagnosis not present

## 2018-11-03 DIAGNOSIS — E1169 Type 2 diabetes mellitus with other specified complication: Secondary | ICD-10-CM | POA: Diagnosis not present

## 2018-11-03 DIAGNOSIS — E782 Mixed hyperlipidemia: Secondary | ICD-10-CM | POA: Diagnosis not present

## 2018-11-03 DIAGNOSIS — I10 Essential (primary) hypertension: Secondary | ICD-10-CM | POA: Diagnosis not present

## 2018-11-16 DIAGNOSIS — K219 Gastro-esophageal reflux disease without esophagitis: Secondary | ICD-10-CM | POA: Diagnosis not present

## 2018-11-16 DIAGNOSIS — I10 Essential (primary) hypertension: Secondary | ICD-10-CM | POA: Diagnosis not present

## 2018-11-16 DIAGNOSIS — E1165 Type 2 diabetes mellitus with hyperglycemia: Secondary | ICD-10-CM | POA: Diagnosis not present

## 2018-11-16 DIAGNOSIS — E119 Type 2 diabetes mellitus without complications: Secondary | ICD-10-CM | POA: Diagnosis not present

## 2018-11-16 DIAGNOSIS — E1169 Type 2 diabetes mellitus with other specified complication: Secondary | ICD-10-CM | POA: Diagnosis not present

## 2018-11-16 DIAGNOSIS — E782 Mixed hyperlipidemia: Secondary | ICD-10-CM | POA: Diagnosis not present

## 2018-12-15 DIAGNOSIS — E782 Mixed hyperlipidemia: Secondary | ICD-10-CM | POA: Diagnosis not present

## 2018-12-15 DIAGNOSIS — K219 Gastro-esophageal reflux disease without esophagitis: Secondary | ICD-10-CM | POA: Diagnosis not present

## 2018-12-15 DIAGNOSIS — I10 Essential (primary) hypertension: Secondary | ICD-10-CM | POA: Diagnosis not present

## 2018-12-15 DIAGNOSIS — E1165 Type 2 diabetes mellitus with hyperglycemia: Secondary | ICD-10-CM | POA: Diagnosis not present

## 2018-12-15 DIAGNOSIS — E1169 Type 2 diabetes mellitus with other specified complication: Secondary | ICD-10-CM | POA: Diagnosis not present

## 2018-12-15 DIAGNOSIS — E119 Type 2 diabetes mellitus without complications: Secondary | ICD-10-CM | POA: Diagnosis not present

## 2019-01-18 DIAGNOSIS — E1165 Type 2 diabetes mellitus with hyperglycemia: Secondary | ICD-10-CM | POA: Diagnosis not present

## 2019-01-18 DIAGNOSIS — E119 Type 2 diabetes mellitus without complications: Secondary | ICD-10-CM | POA: Diagnosis not present

## 2019-01-18 DIAGNOSIS — E782 Mixed hyperlipidemia: Secondary | ICD-10-CM | POA: Diagnosis not present

## 2019-01-18 DIAGNOSIS — K219 Gastro-esophageal reflux disease without esophagitis: Secondary | ICD-10-CM | POA: Diagnosis not present

## 2019-01-18 DIAGNOSIS — E1169 Type 2 diabetes mellitus with other specified complication: Secondary | ICD-10-CM | POA: Diagnosis not present

## 2019-01-18 DIAGNOSIS — I10 Essential (primary) hypertension: Secondary | ICD-10-CM | POA: Diagnosis not present

## 2019-02-23 DIAGNOSIS — Z0000 Encounter for general adult medical examination without abnormal findings: Secondary | ICD-10-CM | POA: Diagnosis not present

## 2019-02-26 DIAGNOSIS — E782 Mixed hyperlipidemia: Secondary | ICD-10-CM | POA: Diagnosis not present

## 2019-02-26 DIAGNOSIS — E119 Type 2 diabetes mellitus without complications: Secondary | ICD-10-CM | POA: Diagnosis not present

## 2019-02-26 DIAGNOSIS — E1165 Type 2 diabetes mellitus with hyperglycemia: Secondary | ICD-10-CM | POA: Diagnosis not present

## 2019-02-26 DIAGNOSIS — R945 Abnormal results of liver function studies: Secondary | ICD-10-CM | POA: Diagnosis not present

## 2019-02-26 DIAGNOSIS — E1169 Type 2 diabetes mellitus with other specified complication: Secondary | ICD-10-CM | POA: Diagnosis not present

## 2019-03-18 DIAGNOSIS — E782 Mixed hyperlipidemia: Secondary | ICD-10-CM | POA: Diagnosis not present

## 2019-03-18 DIAGNOSIS — E1165 Type 2 diabetes mellitus with hyperglycemia: Secondary | ICD-10-CM | POA: Diagnosis not present

## 2019-03-18 DIAGNOSIS — R945 Abnormal results of liver function studies: Secondary | ICD-10-CM | POA: Diagnosis not present

## 2019-04-21 DIAGNOSIS — R945 Abnormal results of liver function studies: Secondary | ICD-10-CM | POA: Diagnosis not present

## 2019-04-21 DIAGNOSIS — E782 Mixed hyperlipidemia: Secondary | ICD-10-CM | POA: Diagnosis not present

## 2019-04-21 DIAGNOSIS — E119 Type 2 diabetes mellitus without complications: Secondary | ICD-10-CM | POA: Diagnosis not present

## 2019-04-21 DIAGNOSIS — E1169 Type 2 diabetes mellitus with other specified complication: Secondary | ICD-10-CM | POA: Diagnosis not present

## 2019-04-21 DIAGNOSIS — E1165 Type 2 diabetes mellitus with hyperglycemia: Secondary | ICD-10-CM | POA: Diagnosis not present

## 2019-05-06 DIAGNOSIS — I10 Essential (primary) hypertension: Secondary | ICD-10-CM | POA: Diagnosis not present

## 2019-05-06 DIAGNOSIS — E782 Mixed hyperlipidemia: Secondary | ICD-10-CM | POA: Diagnosis not present

## 2019-05-06 DIAGNOSIS — E1169 Type 2 diabetes mellitus with other specified complication: Secondary | ICD-10-CM | POA: Diagnosis not present

## 2019-05-06 DIAGNOSIS — E1165 Type 2 diabetes mellitus with hyperglycemia: Secondary | ICD-10-CM | POA: Diagnosis not present

## 2019-05-10 DIAGNOSIS — E1165 Type 2 diabetes mellitus with hyperglycemia: Secondary | ICD-10-CM | POA: Diagnosis not present

## 2019-05-10 DIAGNOSIS — E1169 Type 2 diabetes mellitus with other specified complication: Secondary | ICD-10-CM | POA: Diagnosis not present

## 2019-05-10 DIAGNOSIS — E119 Type 2 diabetes mellitus without complications: Secondary | ICD-10-CM | POA: Diagnosis not present

## 2019-05-10 DIAGNOSIS — E782 Mixed hyperlipidemia: Secondary | ICD-10-CM | POA: Diagnosis not present

## 2019-05-10 DIAGNOSIS — R945 Abnormal results of liver function studies: Secondary | ICD-10-CM | POA: Diagnosis not present

## 2019-05-14 DIAGNOSIS — N3281 Overactive bladder: Secondary | ICD-10-CM | POA: Diagnosis not present

## 2019-05-14 DIAGNOSIS — I10 Essential (primary) hypertension: Secondary | ICD-10-CM | POA: Diagnosis not present

## 2019-05-14 DIAGNOSIS — K219 Gastro-esophageal reflux disease without esophagitis: Secondary | ICD-10-CM | POA: Diagnosis not present

## 2019-05-14 DIAGNOSIS — R944 Abnormal results of kidney function studies: Secondary | ICD-10-CM | POA: Diagnosis not present

## 2019-05-14 DIAGNOSIS — Z0001 Encounter for general adult medical examination with abnormal findings: Secondary | ICD-10-CM | POA: Diagnosis not present

## 2019-05-14 DIAGNOSIS — Z6828 Body mass index (BMI) 28.0-28.9, adult: Secondary | ICD-10-CM | POA: Diagnosis not present

## 2019-05-14 DIAGNOSIS — E663 Overweight: Secondary | ICD-10-CM | POA: Diagnosis not present

## 2019-05-14 DIAGNOSIS — E782 Mixed hyperlipidemia: Secondary | ICD-10-CM | POA: Diagnosis not present

## 2019-05-14 DIAGNOSIS — E1169 Type 2 diabetes mellitus with other specified complication: Secondary | ICD-10-CM | POA: Diagnosis not present

## 2019-06-04 ENCOUNTER — Other Ambulatory Visit: Payer: Self-pay

## 2019-06-10 DIAGNOSIS — E119 Type 2 diabetes mellitus without complications: Secondary | ICD-10-CM | POA: Diagnosis not present

## 2019-06-10 DIAGNOSIS — E1165 Type 2 diabetes mellitus with hyperglycemia: Secondary | ICD-10-CM | POA: Diagnosis not present

## 2019-06-10 DIAGNOSIS — E1169 Type 2 diabetes mellitus with other specified complication: Secondary | ICD-10-CM | POA: Diagnosis not present

## 2019-06-10 DIAGNOSIS — E782 Mixed hyperlipidemia: Secondary | ICD-10-CM | POA: Diagnosis not present

## 2019-06-10 DIAGNOSIS — R945 Abnormal results of liver function studies: Secondary | ICD-10-CM | POA: Diagnosis not present

## 2019-07-13 DIAGNOSIS — E119 Type 2 diabetes mellitus without complications: Secondary | ICD-10-CM | POA: Diagnosis not present

## 2019-07-13 DIAGNOSIS — E1169 Type 2 diabetes mellitus with other specified complication: Secondary | ICD-10-CM | POA: Diagnosis not present

## 2019-07-13 DIAGNOSIS — E782 Mixed hyperlipidemia: Secondary | ICD-10-CM | POA: Diagnosis not present

## 2019-07-13 DIAGNOSIS — R945 Abnormal results of liver function studies: Secondary | ICD-10-CM | POA: Diagnosis not present

## 2019-07-13 DIAGNOSIS — E1165 Type 2 diabetes mellitus with hyperglycemia: Secondary | ICD-10-CM | POA: Diagnosis not present

## 2019-09-21 DIAGNOSIS — E1165 Type 2 diabetes mellitus with hyperglycemia: Secondary | ICD-10-CM | POA: Diagnosis not present

## 2019-09-21 DIAGNOSIS — R945 Abnormal results of liver function studies: Secondary | ICD-10-CM | POA: Diagnosis not present

## 2019-09-21 DIAGNOSIS — E782 Mixed hyperlipidemia: Secondary | ICD-10-CM | POA: Diagnosis not present

## 2019-09-21 DIAGNOSIS — E119 Type 2 diabetes mellitus without complications: Secondary | ICD-10-CM | POA: Diagnosis not present

## 2019-09-21 DIAGNOSIS — E1169 Type 2 diabetes mellitus with other specified complication: Secondary | ICD-10-CM | POA: Diagnosis not present

## 2019-10-26 DIAGNOSIS — E119 Type 2 diabetes mellitus without complications: Secondary | ICD-10-CM | POA: Diagnosis not present

## 2019-10-26 DIAGNOSIS — E782 Mixed hyperlipidemia: Secondary | ICD-10-CM | POA: Diagnosis not present

## 2019-10-26 DIAGNOSIS — E1165 Type 2 diabetes mellitus with hyperglycemia: Secondary | ICD-10-CM | POA: Diagnosis not present

## 2019-10-26 DIAGNOSIS — R945 Abnormal results of liver function studies: Secondary | ICD-10-CM | POA: Diagnosis not present

## 2019-10-26 DIAGNOSIS — E1169 Type 2 diabetes mellitus with other specified complication: Secondary | ICD-10-CM | POA: Diagnosis not present

## 2019-11-15 DIAGNOSIS — E1165 Type 2 diabetes mellitus with hyperglycemia: Secondary | ICD-10-CM | POA: Diagnosis not present

## 2019-11-15 DIAGNOSIS — I10 Essential (primary) hypertension: Secondary | ICD-10-CM | POA: Diagnosis not present

## 2019-11-15 DIAGNOSIS — E119 Type 2 diabetes mellitus without complications: Secondary | ICD-10-CM | POA: Diagnosis not present

## 2019-11-15 DIAGNOSIS — Z712 Person consulting for explanation of examination or test findings: Secondary | ICD-10-CM | POA: Diagnosis not present

## 2019-11-15 DIAGNOSIS — N3281 Overactive bladder: Secondary | ICD-10-CM | POA: Diagnosis not present

## 2019-11-15 DIAGNOSIS — R42 Dizziness and giddiness: Secondary | ICD-10-CM | POA: Diagnosis not present

## 2019-11-15 DIAGNOSIS — H269 Unspecified cataract: Secondary | ICD-10-CM | POA: Diagnosis not present

## 2019-11-17 DIAGNOSIS — I129 Hypertensive chronic kidney disease with stage 1 through stage 4 chronic kidney disease, or unspecified chronic kidney disease: Secondary | ICD-10-CM | POA: Diagnosis not present

## 2019-11-17 DIAGNOSIS — E1169 Type 2 diabetes mellitus with other specified complication: Secondary | ICD-10-CM | POA: Diagnosis not present

## 2019-11-17 DIAGNOSIS — N3281 Overactive bladder: Secondary | ICD-10-CM | POA: Diagnosis not present

## 2019-11-17 DIAGNOSIS — E782 Mixed hyperlipidemia: Secondary | ICD-10-CM | POA: Diagnosis not present

## 2019-11-18 DIAGNOSIS — E7849 Other hyperlipidemia: Secondary | ICD-10-CM | POA: Diagnosis not present

## 2019-11-18 DIAGNOSIS — R945 Abnormal results of liver function studies: Secondary | ICD-10-CM | POA: Diagnosis not present

## 2019-11-18 DIAGNOSIS — E1165 Type 2 diabetes mellitus with hyperglycemia: Secondary | ICD-10-CM | POA: Diagnosis not present

## 2019-11-23 DIAGNOSIS — Z961 Presence of intraocular lens: Secondary | ICD-10-CM | POA: Diagnosis not present

## 2019-11-23 DIAGNOSIS — E119 Type 2 diabetes mellitus without complications: Secondary | ICD-10-CM | POA: Diagnosis not present

## 2019-11-23 DIAGNOSIS — H25812 Combined forms of age-related cataract, left eye: Secondary | ICD-10-CM | POA: Diagnosis not present

## 2020-01-06 DIAGNOSIS — E782 Mixed hyperlipidemia: Secondary | ICD-10-CM | POA: Diagnosis not present

## 2020-01-06 DIAGNOSIS — E1165 Type 2 diabetes mellitus with hyperglycemia: Secondary | ICD-10-CM | POA: Diagnosis not present

## 2020-01-06 DIAGNOSIS — R945 Abnormal results of liver function studies: Secondary | ICD-10-CM | POA: Diagnosis not present

## 2020-01-27 DIAGNOSIS — Z961 Presence of intraocular lens: Secondary | ICD-10-CM | POA: Diagnosis not present

## 2020-01-27 DIAGNOSIS — H25812 Combined forms of age-related cataract, left eye: Secondary | ICD-10-CM | POA: Diagnosis not present

## 2020-01-27 DIAGNOSIS — H40013 Open angle with borderline findings, low risk, bilateral: Secondary | ICD-10-CM | POA: Diagnosis not present

## 2020-02-23 NOTE — H&P (Signed)
Surgical History & Physical  Patient Name: Danielle Joseph DOB: September 06, 1941  Surgery: Cataract extraction with intraocular lens implant phacoemulsification; Left Eye  Surgeon: Fabio Pierce MD Surgery Date:  03/03/2020 Pre-Op Date:  02/23/2020  HPI: A 2 Yr. old female patient -referred by Dr. Daphine Deutscher for cataract OS (PC IOL OD) 1. 1. The patient is returning for a cataract 3 months follow-up of the left eye. Since the last visit, the affected area decreased and is worsening. The patient's vision is blurry. The condition's severity is constant and moderate. Patient has significant glare/halos at night-d/c driving at night. Overall decreased VA OS. This is negatively affecting the patient's quality of life. OD seems stable. Dry eyes from time to time. Has Rx for drops BID (with heat) from another OD but does not know what it is. Patient admits rarely uses. Patient has no pain or redness.  Medical History: Cataracts no family h/o glaucoma or AMD Diabetes High Blood Pressure LDL  Review of Systems Negative Allergic/Immunologic Negative Cardiovascular Negative Constitutional Negative Ear, Nose, Mouth & Throat Negative Endocrine Negative Eyes Negative Gastrointestinal Negative Genitourinary Negative Hemotologic/Lymphatic Negative Integumentary Negative Musculoskeletal Negative Neurological Negative Psychiatry Negative Respiratory  Social   Never smoked   Medication Tresiba, Atorvastatin, Lisinopril, Myrbetriq,   Sx/Procedures  None  Drug Allergies   NKDA  History & Physical: Heent:  Cataract, Left eye NECK: supple without bruits LUNGS: lungs clear to auscultation CV: regular rate and rhythm Abdomen: soft and non-tender  Impression & Plan: Assessment: 1.  COMBINED FORMS AGE RELATED CATARACT; Left Eye (H25.812) 2.  INTRAOCULAR LENS IOL (Z96.1) 3.  OAG BORDERLINE FINDINGS LOW RISK; Both Eyes (H40.013)  Plan: 1.  Cataract accounts for the patient's decreased vision.  This visual impairment is not correctable with a tolerable change in glasses or contact lenses. Cataract surgery with an implantation of a new lens should significantly improve the visual and functional status of the patient. Discussed all risks, benefits, alternatives, and potential complications. Discussed the procedures and recovery. Patient desires to have surgery. A-scan ordered and performed today for intra-ocular lens calculations. The surgery will be performed in order to improve vision for driving, reading, and for eye examinations. Recommend phacoemulsification with intra-ocular lens. Left Eye. Surgery required to correct imbalance of vision. Dilates well - shugarcaine by protocol. 2.  Stable. 3.  Based on cup-to-disc ratio. Negative Family history. OCT rFNL today shows: borderline OU. Will eval with pachy and HVF after cataract surgery Detailed discussion about glaucoma today including importance of maintaining good follow up and following treatment plan, and the possibility of irreversible blindness as part of this disease process.

## 2020-02-28 DIAGNOSIS — H25812 Combined forms of age-related cataract, left eye: Secondary | ICD-10-CM | POA: Diagnosis not present

## 2020-02-28 NOTE — Patient Instructions (Addendum)
Danielle Joseph  02/28/2020     @PREFPERIOPPHARMACY @   Your procedure is scheduled on  03/03/2020 .  Report to [facility] at  1000  A.M.  Call this number if you have problems the morning of surgery:  [phone]   Remember:  Do not eat or drink after midnight.                      Take these medicines the morning of surgery with A SIP OF WATER None. Take 1/2 of your tresiba (10 units) the night before your procedure.    Do not wear jewelry, make-up or nail polish.  Do not wear lotions, powders, or perfumes. Please wear deodorant and brush your teeth.  Do not shave 48 hours prior to surgery.  Men may shave face and neck.  Do not bring valuables to the hospital.  [facility] is not responsible for any belongings or valuables.  Contacts, dentures or bridgework may not be worn into surgery.  Leave your suitcase in the car.  After surgery it may be brought to your room.  For patients admitted to the hospital, discharge time will be determined by your treatment team.  Patients discharged the day of surgery will not be allowed to drive home.   Name and phone number of your driver:   family Special instructions:  DO NOT smoke the day of your procedure.  Please read over the following fact sheets that you were given. Anesthesia Post-op Instructions and Care and Recovery After Surgery       Cataract Surgery, Care After This sheet gives you information about how to care for yourself after your procedure. Your health care provider may also give you more specific instructions. If you have problems or questions, contact your health care provider. What can I expect after the procedure? After the procedure, it is common to have:  Itching.  Discomfort.  Fluid discharge.  Sensitivity to light and to touch.  Bruising in or around the eye.  Mild blurred vision. Follow these instructions at home: Eye care   Do not touch or rub your eyes.  Protect your eyes as told by  your health care provider. You may be told to wear a protective eye shield or sunglasses.  Do not put a contact lens into the affected eye or eyes until your health care provider approves.  Keep the area around your eye clean and dry: ? Avoid swimming. ? Do not allow water to hit you directly in the face while showering. ? Keep soap and shampoo out of your eyes.  Check your eye every day for signs of infection. Watch for: ? Redness, swelling, or pain. ? Fluid, blood, or pus. ? Warmth. ? A bad smell. ? Vision that is getting worse. ? Sensitivity that is getting worse. Activity  Do not drive for 24 hours if you were given a sedative during your procedure.  Avoid strenuous activities, such as playing contact sports, for as long as told by your health care provider.  Do not drive or use heavy machinery until your health care provider approves.  Do not bend or lift heavy objects. Bending increases pressure in the eye. You can walk, climb stairs, and do light household chores.  Ask your health care provider when you can return to work. If you work in a dusty environment, you may be advised to wear protective eyewear for a period of time. General instructions  Take or apply over-the-counter and prescription medicines only as told by your health care provider. This includes eye drops.  Keep all follow-up visits as told by your health care provider. This is important. Contact a health care provider if:  You have increased bruising around your eye.  You have pain that is not helped with medicine.  You have a fever.  You have redness, swelling, or pain in your eye.  You have fluid, blood, or pus coming from your incision.  Your vision gets worse.  Your sensitivity to light gets worse. Get help right away if:  You have sudden loss of vision.  You see flashes of light or spots (floaters).  You have severe eye pain.  You develop nausea or vomiting. Summary  After your  procedure, it is common to have itching, discomfort, bruising, fluid discharge, or sensitivity to light.  Follow instructions from your health care provider about caring for your eye after the procedure.  Do not rub your eye after the procedure. You may need to wear eye protection or sunglasses. Do not wear contact lenses. Keep the area around your eye clean and dry.  Avoid activities that require a lot of effort. These include playing sports and lifting heavy objects.  Contact a health care provider if you have increased bruising, pain that does not go away, or a fever. Get help right away if you suddenly lose your vision, see flashes of light or spots, or have severe pain in the eye. This information is not intended to replace advice given to you by your health care provider. Make sure you discuss any questions you have with your health care provider. Document Revised: 08/17/2019 Document Reviewed: 04/20/2018 Elsevier Patient Education  2020 [place] After These instructions provide you with information about caring for yourself after your procedure. Your health care provider may also give you more specific instructions. Your treatment has been planned according to current medical practices, but problems sometimes occur. Call your health care provider if you have any problems or questions after your procedure. What can I expect after the procedure? After your procedure, you may:  Feel sleepy for several hours.  Feel clumsy and have poor balance for several hours.  Feel forgetful about what happened after the procedure.  Have poor judgment for several hours.  Feel nauseous or vomit.  Have a sore throat if you had a breathing tube during the procedure. Follow these instructions at home: For at least 24 hours after the procedure:      Have a responsible adult stay with you. It is important to have someone help care for you until you are awake and  alert.  Rest as needed.  Do not: ? Participate in activities in which you could fall or become injured. ? Drive. ? Use heavy machinery. ? Drink alcohol. ? Take sleeping pills or medicines that cause drowsiness. ? Make important decisions or sign legal documents. ? Take care of children on your own. Eating and drinking  Follow the diet that is recommended by your health care provider.  If you vomit, drink water, juice, or soup when you can drink without vomiting.  Make sure you have little or no nausea before eating solid foods. General instructions  Take over-the-counter and prescription medicines only as told by your health care provider.  If you have sleep apnea, surgery and certain medicines can increase your risk for breathing problems. Follow instructions from your health care provider about wearing  your sleep device: ? Anytime you are sleeping, including during daytime naps. ? While taking prescription pain medicines, sleeping medicines, or medicines that make you drowsy.  If you smoke, do not smoke without supervision.  Keep all follow-up visits as told by your health care provider. This is important. Contact a health care provider if:  You keep feeling nauseous or you keep vomiting.  You feel light-headed.  You develop a rash.  You have a fever. Get help right away if:  You have trouble breathing. Summary  For several hours after your procedure, you may feel sleepy and have poor judgment.  Have a responsible adult stay with you for at least 24 hours or until you are awake and alert. This information is not intended to replace advice given to you by your health care provider. Make sure you discuss any questions you have with your health care provider. Document Revised: 01/19/2018 Document Reviewed: 02/11/2016 Elsevier Patient Education  2020 [employer].

## 2020-03-01 ENCOUNTER — Other Ambulatory Visit: Payer: Self-pay

## 2020-03-01 ENCOUNTER — Encounter (HOSPITAL_COMMUNITY)
Admission: RE | Admit: 2020-03-01 | Discharge: 2020-03-01 | Disposition: A | Discharge status: Home / Self Care | Payer: Medicare Other | Source: Ambulatory Visit | Attending: Ophthalmology | Admitting: Ophthalmology

## 2020-03-01 ENCOUNTER — Encounter (HOSPITAL_COMMUNITY): Payer: Self-pay

## 2020-03-01 ENCOUNTER — Other Ambulatory Visit (HOSPITAL_COMMUNITY)
Admission: RE | Admit: 2020-03-01 | Discharge: 2020-03-01 | Disposition: A | Discharge status: Home / Self Care | Payer: Medicare Other | Source: Ambulatory Visit | Attending: Ophthalmology | Admitting: Ophthalmology

## 2020-03-01 DIAGNOSIS — Z20822 Contact with and (suspected) exposure to covid-19: Secondary | ICD-10-CM | POA: Insufficient documentation

## 2020-03-01 DIAGNOSIS — Z01812 Encounter for preprocedural laboratory examination: Secondary | ICD-10-CM | POA: Diagnosis not present

## 2020-03-01 LAB — BASIC METABOLIC PANEL
Anion gap: 8 (ref 5–15)
BUN: 13 mg/dL (ref 8–23)
CO2: 29 mmol/L (ref 22–32)
Calcium: 9.2 mg/dL (ref 8.9–10.3)
Chloride: 102 mmol/L (ref 98–111)
Creatinine, Ser: 0.84 mg/dL (ref 0.44–1.00)
GFR calc Af Amer: >60 mL/min (ref >60)
GFR calc non Af Amer: >60 mL/min (ref >60)
Glucose, Bld: 144 mg/dL — ABNORMAL HIGH (ref 70–99)
Potassium: 3.9 mmol/L (ref 3.5–5.1)
Sodium: 139 mmol/L (ref 135–145)

## 2020-03-01 LAB — HEMOGLOBIN A1C
Hgb A1c MFr Bld: 6.3 % — ABNORMAL HIGH (ref 4.8–5.6)
Mean Plasma Glucose: 134.11 mg/dL

## 2020-03-02 LAB — SARS CORONAVIRUS 2 (TAT 6-24 HRS): SARS Coronavirus 2: NEGATIVE

## 2020-03-03 ENCOUNTER — Encounter (HOSPITAL_COMMUNITY): Payer: Self-pay | Admitting: Ophthalmology

## 2020-03-03 ENCOUNTER — Encounter (HOSPITAL_COMMUNITY)
Admission: RE | Disposition: A | Discharge status: Home / Self Care | Payer: Self-pay | Source: Home / Self Care | Attending: Ophthalmology

## 2020-03-03 ENCOUNTER — Ambulatory Visit (HOSPITAL_COMMUNITY): Payer: Medicare Other | Admitting: Anesthesiology

## 2020-03-03 ENCOUNTER — Ambulatory Visit (HOSPITAL_COMMUNITY)
Admission: RE | Admit: 2020-03-03 | Discharge: 2020-03-03 | Disposition: A | Discharge status: Home / Self Care | Payer: Medicare Other | Attending: Ophthalmology | Admitting: Ophthalmology

## 2020-03-03 DIAGNOSIS — M1990 Unspecified osteoarthritis, unspecified site: Secondary | ICD-10-CM | POA: Insufficient documentation

## 2020-03-03 DIAGNOSIS — E1136 Type 2 diabetes mellitus with diabetic cataract: Secondary | ICD-10-CM | POA: Insufficient documentation

## 2020-03-03 DIAGNOSIS — H25812 Combined forms of age-related cataract, left eye: Secondary | ICD-10-CM | POA: Diagnosis not present

## 2020-03-03 DIAGNOSIS — I10 Essential (primary) hypertension: Secondary | ICD-10-CM | POA: Diagnosis not present

## 2020-03-03 DIAGNOSIS — Z79899 Other long term (current) drug therapy: Secondary | ICD-10-CM | POA: Diagnosis not present

## 2020-03-03 HISTORY — PX: CATARACT EXTRACTION W/PHACO: SHX586

## 2020-03-03 LAB — GLUCOSE, CAPILLARY: Glucose-Capillary: 105 mg/dL — ABNORMAL HIGH (ref 70–99)

## 2020-03-03 SURGERY — PHACOEMULSIFICATION, CATARACT, WITH IOL INSERTION
Anesthesia: Monitor Anesthesia Care | Site: Eye | Laterality: Left

## 2020-03-03 MED ORDER — NEOMYCIN-POLYMYXIN-DEXAMETH 3.5-10000-0.1 OP SUSP
OPHTHALMIC | Status: DC | PRN
  Administered 2020-03-03: 1 [drp] via OPHTHALMIC

## 2020-03-03 MED ORDER — CYCLOPENTOLATE-PHENYLEPHRINE 0.2-1 % OP SOLN
1.0000 [drp] | OPHTHALMIC | Status: AC | PRN
  Administered 2020-03-03 (×3): 1 [drp] via OPHTHALMIC

## 2020-03-03 MED ORDER — TETRACAINE HCL 0.5 % OP SOLN
1.0000 [drp] | OPHTHALMIC | Status: DC | PRN
  Administered 2020-03-03 (×2): 1 [drp] via OPHTHALMIC

## 2020-03-03 MED ORDER — SODIUM HYALURONATE 23 MG/ML IO SOLN
INTRAOCULAR | Status: DC | PRN
  Administered 2020-03-03: 0.6 mL via INTRAOCULAR

## 2020-03-03 MED ORDER — BSS IO SOLN
INTRAOCULAR | Status: DC | PRN
  Administered 2020-03-03: 15 mL via INTRAOCULAR

## 2020-03-03 MED ORDER — PHENYLEPHRINE HCL 2.5 % OP SOLN
1.0000 [drp] | OPHTHALMIC | Status: AC | PRN
  Administered 2020-03-03 (×3): 1 [drp] via OPHTHALMIC

## 2020-03-03 MED ORDER — LIDOCAINE HCL (PF) 1 % IJ SOLN
INTRAOCULAR | Status: DC | PRN
  Administered 2020-03-03: 11:00:00 1 mL via OPHTHALMIC

## 2020-03-03 MED ORDER — LIDOCAINE HCL 3.5 % OP GEL
1.0000 "application " | Freq: Once | OPHTHALMIC | Status: AC
  Administered 2020-03-03: 1 via OPHTHALMIC

## 2020-03-03 MED ORDER — EPINEPHRINE PF 1 MG/ML IJ SOLN
INTRAOCULAR | Status: DC | PRN
  Administered 2020-03-03: 11:00:00 500 mL

## 2020-03-03 MED ORDER — PROVISC 10 MG/ML IO SOLN
INTRAOCULAR | Status: DC | PRN
  Administered 2020-03-03: 0.85 mL via INTRAOCULAR

## 2020-03-03 MED ORDER — MIDAZOLAM HCL 2 MG/2ML IJ SOLN
INTRAMUSCULAR | Status: DC | PRN
  Administered 2020-03-03 (×2): .5 mg via INTRAVENOUS

## 2020-03-03 MED ORDER — MIDAZOLAM HCL 2 MG/2ML IJ SOLN
INTRAMUSCULAR | Status: AC
  Filled 2020-03-03: qty 2

## 2020-03-03 MED ORDER — POVIDONE-IODINE 5 % OP SOLN
OPHTHALMIC | Status: DC | PRN
  Administered 2020-03-03: 1 via OPHTHALMIC

## 2020-03-03 SURGICAL SUPPLY — 11 items
CLOTH BEACON ORANGE TIMEOUT ST (SAFETY) ×3 IMPLANT
EYE SHIELD UNIVERSAL CLEAR (GAUZE/BANDAGES/DRESSINGS) ×3 IMPLANT
GLOVE BIOGEL PI IND STRL 7.0 (GLOVE) ×2 IMPLANT
GLOVE BIOGEL PI INDICATOR 7.0 (GLOVE) ×4
LENS ALC ACRYL/TECN (Ophthalmic Related) ×3 IMPLANT
NEEDLE HYPO 18GX1.5 BLUNT FILL (NEEDLE) ×3 IMPLANT
PAD ARMBOARD 7.5X6 YLW CONV (MISCELLANEOUS) ×3 IMPLANT
SYR TB 1ML LL NO SAFETY (SYRINGE) ×3 IMPLANT
TAPE PAPER 2X10 WHT MICROPORE (GAUZE/BANDAGES/DRESSINGS) ×3 IMPLANT
VISCOELASTIC ADDITIONAL (OPHTHALMIC RELATED) ×3 IMPLANT
WATER STERILE IRR 250ML POUR (IV SOLUTION) ×3 IMPLANT

## 2020-03-03 NOTE — Interval H&P Note (Signed)
History and Physical Interval Note: The H and P was reviewed and updated. The patient was examined.  No changes were found after exam.  The surgical eye was marked.  03/03/2020 10:41 AM  [name]  has presented today for surgery, with the diagnosis of Nuclear sclerotic cataract - Left eye.  The various methods of treatment have been discussed with the patient and family. After consideration of risks, benefits and other options for treatment, the patient has consented to  Procedure(s) with comments: CATARACT EXTRACTION PHACO AND INTRAOCULAR LENS PLACEMENT (IOC) (Left) - left as a surgical intervention.  The patient's history has been reviewed, patient examined, no change in status, stable for surgery.  I have reviewed the patient's chart and labs.  Questions were answered to the patient's satisfaction.     Danielle Joseph

## 2020-03-03 NOTE — Discharge Instructions (Signed)
Please discharge patient when stable, will follow up today with Dr. Ammie Warrick at the [facility] office immediately following discharge.  Leave shield in place until visit.  All paperwork with discharge instructions will be given at the office.  [facility] Address:  [address]  [place], [place] [address]  

## 2020-03-03 NOTE — Anesthesia Preprocedure Evaluation (Signed)
Anesthesia Evaluation  Patient identified by MRN, date of birth, ID band Patient awake    Reviewed: Allergy & Precautions, NPO status , Patient's Chart, lab work & pertinent test results  History of Anesthesia Complications Negative for: history of anesthetic complications  Airway Mallampati: II  TM Distance: >3 FB Neck ROM: Full    Dental  (+) Missing   Pulmonary neg pulmonary ROS,    Pulmonary exam normal breath sounds clear to auscultation       Cardiovascular Exercise Tolerance: Good hypertension, Pt. on medications Normal cardiovascular exam Rhythm:Regular Rate:Normal  18-Jun-2017 10:35:22 [facility] ROUTINE RECORD Sinus rhythm Consider left atrial enlargement Right axis deviation Probable anteroseptal infarct, old No old tracing to compare Confirmed by Davonna Belling [phone]) on 06/18/2017 10:42:56 AM   Neuro/Psych negative neurological ROS  negative psych ROS   GI/Hepatic GERD  Medicated,  Endo/Other  diabetes (not on meds), Well Controlled, Type 2  Renal/GU      Musculoskeletal  (+) Arthritis , Osteoarthritis,    Abdominal   Peds  Hematology   Anesthesia Other Findings   Reproductive/Obstetrics                           Anesthesia Physical Anesthesia Plan  ASA: II  Anesthesia Plan: MAC   Post-op Pain Management:    Induction:   PONV Risk Score and Plan:   Airway Management Planned: Nasal Cannula and Natural Airway  Additional Equipment:   Intra-op Plan:   Post-operative Plan:   Informed Consent: I have reviewed the patients History and Physical, chart, labs and discussed the procedure including the risks, benefits and alternatives for the proposed anesthesia with the patient or authorized representative who has indicated his/her understanding and acceptance.     Dental advisory given  Plan Discussed with: CRNA and Surgeon  Anesthesia  Plan Comments:        Anesthesia Quick Evaluation

## 2020-03-03 NOTE — Op Note (Signed)
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 Bryony Kaman, MD, MA  Anesthesia: MAC, Topical Akten  Complications: None  Estimated Blood Loss: <41m (minimal)  Specimens: None  Implants: As above  Indications:  Visually significant age-related cataract, Left Eye  Procedure:  The patient was seen and identified in the pre-operative area. The operative eye was identified and dilated.  The operative eye was marked.  Topical anesthesia was administered to the operative eye.     The patient was then to the operative suite and placed in the supine position.  A timeout was performed confirming the patient, procedure to be performed, and all other relevant information.   The patient's face was prepped and draped in the usual fashion for intra-ocular surgery.  A lid speculum was placed into the operative eye and the surgical microscope moved into place and focused.  An inferotemporal paracentesis was created using a 20 gauge paracentesis blade.  Shugarcaine was injected into the anterior chamber.  Viscoelastic was injected into the anterior chamber.  A temporal clear-corneal main wound incision was created using a 2.410mmicrokeratome.  A continuous curvilinear capsulorrhexis was initiated using an irrigating cystitome and completed using capsulorrhexis forceps.  Hydrodissection and hydrodeliniation were performed.  Viscoelastic was injected into the anterior chamber.  A phacoemulsification handpiece and a chopper as a second instrument were used to remove the nucleus and epinucleus. The irrigation/aspiration handpiece was used to remove  any remaining cortical material.   The capsular bag was reinflated with viscoelastic, checked, and found to be intact.  The intraocular lens was inserted into the capsular bag.  The irrigation/aspiration handpiece was used to remove any remaining viscoelastic.  The clear corneal wound and paracentesis wounds were then hydrated and checked with Weck-Cels to be watertight.  The lid-speculum was removed.  The drape was removed.  The patient's face was cleaned with a wet and dry 4x4.   Maxitrol was instilled in the eye before a clear shield was taped over the eye. The patient was taken to the post-operative care unit in good condition, having tolerated the procedure well.  Post-Op Instructions: [facility] and will receive all other orders and instructions.

## 2020-03-03 NOTE — Transfer of Care (Signed)
Immediate Anesthesia Transfer of Care Note  Patient: Danielle Joseph  Procedure(s) Performed: CATARACT EXTRACTION PHACO AND INTRAOCULAR LENS PLACEMENT LEFT EYE (Left Eye)  Patient Location: Short Stay  Anesthesia Type:MAC  Level of Consciousness: awake, alert , oriented and patient cooperative  Airway & Oxygen Therapy: Patient Spontanous Breathing  Post-op Assessment: Report given to RN and Post -op Vital signs reviewed and stable  Post vital signs: Reviewed and stable  Last Vitals:  Vitals Value Taken Time  BP    Temp    Pulse    Resp    SpO2      Last Pain:  Vitals:   03/03/20 1009  TempSrc: Oral  PainSc: 0-No pain      Patients Stated Pain Goal: 6 ([id number])  Complications: No apparent anesthesia complications

## 2020-03-03 NOTE — Anesthesia Postprocedure Evaluation (Signed)
Anesthesia Post Note  Patient: Danielle Joseph  Procedure(s) Performed: CATARACT EXTRACTION PHACO AND INTRAOCULAR LENS PLACEMENT LEFT EYE (Left Eye)  Patient location during evaluation: Short Stay Anesthesia Type: MAC Level of consciousness: awake and alert Pain management: pain level controlled Vital Signs Assessment: post-procedure vital signs reviewed and stable Respiratory status: spontaneous breathing Cardiovascular status: stable Postop Assessment: no apparent nausea or vomiting Anesthetic complications: no     Last Vitals:  Vitals:   03/03/20 1009  BP: 127/88  Resp: 16  SpO2: 98%    Last Pain:  Vitals:   03/03/20 1009  TempSrc: Oral  PainSc: 0-No pain                 Everette Rank

## 2020-03-09 DIAGNOSIS — E782 Mixed hyperlipidemia: Secondary | ICD-10-CM | POA: Diagnosis not present

## 2020-03-09 DIAGNOSIS — R945 Abnormal results of liver function studies: Secondary | ICD-10-CM | POA: Diagnosis not present

## 2020-03-09 DIAGNOSIS — E1165 Type 2 diabetes mellitus with hyperglycemia: Secondary | ICD-10-CM | POA: Diagnosis not present

## 2020-05-19 DIAGNOSIS — E782 Mixed hyperlipidemia: Secondary | ICD-10-CM | POA: Diagnosis not present

## 2020-05-19 DIAGNOSIS — E119 Type 2 diabetes mellitus without complications: Secondary | ICD-10-CM | POA: Diagnosis not present

## 2020-05-19 DIAGNOSIS — E1169 Type 2 diabetes mellitus with other specified complication: Secondary | ICD-10-CM | POA: Diagnosis not present

## 2020-05-19 DIAGNOSIS — N182 Chronic kidney disease, stage 2 (mild): Secondary | ICD-10-CM | POA: Diagnosis not present

## 2020-05-19 DIAGNOSIS — E7849 Other hyperlipidemia: Secondary | ICD-10-CM | POA: Diagnosis not present

## 2020-05-22 DIAGNOSIS — N3281 Overactive bladder: Secondary | ICD-10-CM | POA: Diagnosis not present

## 2020-05-22 DIAGNOSIS — E119 Type 2 diabetes mellitus without complications: Secondary | ICD-10-CM | POA: Diagnosis not present

## 2020-05-22 DIAGNOSIS — Z712 Person consulting for explanation of examination or test findings: Secondary | ICD-10-CM | POA: Diagnosis not present

## 2020-05-22 DIAGNOSIS — I129 Hypertensive chronic kidney disease with stage 1 through stage 4 chronic kidney disease, or unspecified chronic kidney disease: Secondary | ICD-10-CM | POA: Diagnosis not present

## 2020-05-22 DIAGNOSIS — N182 Chronic kidney disease, stage 2 (mild): Secondary | ICD-10-CM | POA: Diagnosis not present

## 2020-05-23 DIAGNOSIS — E1169 Type 2 diabetes mellitus with other specified complication: Secondary | ICD-10-CM | POA: Diagnosis not present

## 2020-05-23 DIAGNOSIS — Z0001 Encounter for general adult medical examination with abnormal findings: Secondary | ICD-10-CM | POA: Diagnosis not present

## 2020-05-23 DIAGNOSIS — R944 Abnormal results of kidney function studies: Secondary | ICD-10-CM | POA: Diagnosis not present

## 2020-05-23 DIAGNOSIS — E782 Mixed hyperlipidemia: Secondary | ICD-10-CM | POA: Diagnosis not present

## 2020-05-23 DIAGNOSIS — I10 Essential (primary) hypertension: Secondary | ICD-10-CM | POA: Diagnosis not present

## 2021-01-01 DIAGNOSIS — E782 Mixed hyperlipidemia: Secondary | ICD-10-CM | POA: Diagnosis not present

## 2021-01-01 DIAGNOSIS — E1169 Type 2 diabetes mellitus with other specified complication: Secondary | ICD-10-CM | POA: Diagnosis not present

## 2021-01-31 DIAGNOSIS — E782 Mixed hyperlipidemia: Secondary | ICD-10-CM | POA: Diagnosis not present

## 2021-01-31 DIAGNOSIS — E1169 Type 2 diabetes mellitus with other specified complication: Secondary | ICD-10-CM | POA: Diagnosis not present

## 2021-01-31 DIAGNOSIS — E1165 Type 2 diabetes mellitus with hyperglycemia: Secondary | ICD-10-CM | POA: Diagnosis not present

## 2021-01-31 DIAGNOSIS — I10 Essential (primary) hypertension: Secondary | ICD-10-CM | POA: Diagnosis not present

## 2021-04-09 DIAGNOSIS — I10 Essential (primary) hypertension: Secondary | ICD-10-CM | POA: Diagnosis not present

## 2021-04-09 DIAGNOSIS — E119 Type 2 diabetes mellitus without complications: Secondary | ICD-10-CM | POA: Diagnosis not present

## 2021-04-12 DIAGNOSIS — E782 Mixed hyperlipidemia: Secondary | ICD-10-CM | POA: Diagnosis not present

## 2021-04-12 DIAGNOSIS — I129 Hypertensive chronic kidney disease with stage 1 through stage 4 chronic kidney disease, or unspecified chronic kidney disease: Secondary | ICD-10-CM | POA: Diagnosis not present

## 2021-04-12 DIAGNOSIS — R945 Abnormal results of liver function studies: Secondary | ICD-10-CM | POA: Diagnosis not present

## 2021-04-12 DIAGNOSIS — R309 Painful micturition, unspecified: Secondary | ICD-10-CM | POA: Diagnosis not present

## 2021-04-12 DIAGNOSIS — E1165 Type 2 diabetes mellitus with hyperglycemia: Secondary | ICD-10-CM | POA: Diagnosis not present

## 2021-04-12 DIAGNOSIS — N3281 Overactive bladder: Secondary | ICD-10-CM | POA: Diagnosis not present

## 2021-04-12 DIAGNOSIS — N182 Chronic kidney disease, stage 2 (mild): Secondary | ICD-10-CM | POA: Diagnosis not present

## 2021-05-22 DIAGNOSIS — Z7984 Long term (current) use of oral hypoglycemic drugs: Secondary | ICD-10-CM | POA: Diagnosis not present

## 2021-05-22 DIAGNOSIS — E119 Type 2 diabetes mellitus without complications: Secondary | ICD-10-CM | POA: Diagnosis not present

## 2021-05-22 DIAGNOSIS — H524 Presbyopia: Secondary | ICD-10-CM | POA: Diagnosis not present

## 2021-05-22 DIAGNOSIS — Z961 Presence of intraocular lens: Secondary | ICD-10-CM | POA: Diagnosis not present

## 2021-09-01 DIAGNOSIS — E782 Mixed hyperlipidemia: Secondary | ICD-10-CM | POA: Diagnosis not present

## 2021-09-01 DIAGNOSIS — J069 Acute upper respiratory infection, unspecified: Secondary | ICD-10-CM | POA: Diagnosis not present

## 2021-09-01 DIAGNOSIS — N3281 Overactive bladder: Secondary | ICD-10-CM | POA: Diagnosis not present

## 2021-09-03 DIAGNOSIS — I10 Essential (primary) hypertension: Secondary | ICD-10-CM | POA: Diagnosis not present

## 2021-09-03 DIAGNOSIS — E785 Hyperlipidemia, unspecified: Secondary | ICD-10-CM | POA: Diagnosis not present

## 2021-10-03 DIAGNOSIS — E785 Hyperlipidemia, unspecified: Secondary | ICD-10-CM | POA: Diagnosis not present

## 2021-10-03 DIAGNOSIS — I10 Essential (primary) hypertension: Secondary | ICD-10-CM | POA: Diagnosis not present

## 2021-10-15 DIAGNOSIS — I10 Essential (primary) hypertension: Secondary | ICD-10-CM | POA: Diagnosis not present

## 2021-10-15 DIAGNOSIS — E119 Type 2 diabetes mellitus without complications: Secondary | ICD-10-CM | POA: Diagnosis not present

## 2021-10-18 DIAGNOSIS — Z0001 Encounter for general adult medical examination with abnormal findings: Secondary | ICD-10-CM | POA: Diagnosis not present

## 2021-10-18 DIAGNOSIS — E782 Mixed hyperlipidemia: Secondary | ICD-10-CM | POA: Diagnosis not present

## 2021-10-18 DIAGNOSIS — R945 Abnormal results of liver function studies: Secondary | ICD-10-CM | POA: Diagnosis not present

## 2021-10-18 DIAGNOSIS — N1831 Chronic kidney disease, stage 3a: Secondary | ICD-10-CM | POA: Diagnosis not present

## 2021-10-18 DIAGNOSIS — E1165 Type 2 diabetes mellitus with hyperglycemia: Secondary | ICD-10-CM | POA: Diagnosis not present

## 2021-10-18 DIAGNOSIS — N3281 Overactive bladder: Secondary | ICD-10-CM | POA: Diagnosis not present

## 2021-10-18 DIAGNOSIS — I129 Hypertensive chronic kidney disease with stage 1 through stage 4 chronic kidney disease, or unspecified chronic kidney disease: Secondary | ICD-10-CM | POA: Diagnosis not present

## 2022-04-18 DIAGNOSIS — I10 Essential (primary) hypertension: Secondary | ICD-10-CM | POA: Diagnosis not present

## 2022-04-18 DIAGNOSIS — E782 Mixed hyperlipidemia: Secondary | ICD-10-CM | POA: Diagnosis not present

## 2022-04-18 DIAGNOSIS — E119 Type 2 diabetes mellitus without complications: Secondary | ICD-10-CM | POA: Diagnosis not present

## 2022-04-23 DIAGNOSIS — I129 Hypertensive chronic kidney disease with stage 1 through stage 4 chronic kidney disease, or unspecified chronic kidney disease: Secondary | ICD-10-CM | POA: Diagnosis not present

## 2022-04-23 DIAGNOSIS — R0781 Pleurodynia: Secondary | ICD-10-CM | POA: Diagnosis not present

## 2022-04-23 DIAGNOSIS — Z0001 Encounter for general adult medical examination with abnormal findings: Secondary | ICD-10-CM | POA: Diagnosis not present

## 2022-04-23 DIAGNOSIS — N3281 Overactive bladder: Secondary | ICD-10-CM | POA: Diagnosis not present

## 2022-04-23 DIAGNOSIS — R945 Abnormal results of liver function studies: Secondary | ICD-10-CM | POA: Diagnosis not present

## 2022-04-23 DIAGNOSIS — E1165 Type 2 diabetes mellitus with hyperglycemia: Secondary | ICD-10-CM | POA: Diagnosis not present

## 2022-04-23 DIAGNOSIS — E782 Mixed hyperlipidemia: Secondary | ICD-10-CM | POA: Diagnosis not present

## 2022-04-23 DIAGNOSIS — N1831 Chronic kidney disease, stage 3a: Secondary | ICD-10-CM | POA: Diagnosis not present

## 2022-10-16 DIAGNOSIS — E119 Type 2 diabetes mellitus without complications: Secondary | ICD-10-CM | POA: Diagnosis not present

## 2022-10-16 DIAGNOSIS — I10 Essential (primary) hypertension: Secondary | ICD-10-CM | POA: Diagnosis not present

## 2022-10-16 DIAGNOSIS — E782 Mixed hyperlipidemia: Secondary | ICD-10-CM | POA: Diagnosis not present

## 2022-10-23 DIAGNOSIS — Z0000 Encounter for general adult medical examination without abnormal findings: Secondary | ICD-10-CM | POA: Diagnosis not present

## 2022-10-23 DIAGNOSIS — R945 Abnormal results of liver function studies: Secondary | ICD-10-CM | POA: Diagnosis not present

## 2022-10-23 DIAGNOSIS — E1122 Type 2 diabetes mellitus with diabetic chronic kidney disease: Secondary | ICD-10-CM | POA: Diagnosis not present

## 2022-10-23 DIAGNOSIS — N3281 Overactive bladder: Secondary | ICD-10-CM | POA: Diagnosis not present

## 2022-10-23 DIAGNOSIS — N1831 Chronic kidney disease, stage 3a: Secondary | ICD-10-CM | POA: Diagnosis not present

## 2022-10-23 DIAGNOSIS — E782 Mixed hyperlipidemia: Secondary | ICD-10-CM | POA: Diagnosis not present

## 2022-10-23 DIAGNOSIS — K30 Functional dyspepsia: Secondary | ICD-10-CM | POA: Diagnosis not present

## 2022-10-23 DIAGNOSIS — E1165 Type 2 diabetes mellitus with hyperglycemia: Secondary | ICD-10-CM | POA: Diagnosis not present

## 2022-10-23 DIAGNOSIS — R600 Localized edema: Secondary | ICD-10-CM | POA: Diagnosis not present

## 2022-10-23 DIAGNOSIS — I129 Hypertensive chronic kidney disease with stage 1 through stage 4 chronic kidney disease, or unspecified chronic kidney disease: Secondary | ICD-10-CM | POA: Diagnosis not present

## 2022-10-23 DIAGNOSIS — Z0001 Encounter for general adult medical examination with abnormal findings: Secondary | ICD-10-CM | POA: Diagnosis not present

## 2023-03-25 DIAGNOSIS — H16223 Keratoconjunctivitis sicca, not specified as Sjogren's, bilateral: Secondary | ICD-10-CM | POA: Diagnosis not present

## 2023-03-25 DIAGNOSIS — Z794 Long term (current) use of insulin: Secondary | ICD-10-CM | POA: Diagnosis not present

## 2023-03-25 DIAGNOSIS — E109 Type 1 diabetes mellitus without complications: Secondary | ICD-10-CM | POA: Diagnosis not present

## 2023-03-25 DIAGNOSIS — Z961 Presence of intraocular lens: Secondary | ICD-10-CM | POA: Diagnosis not present

## 2023-04-22 DIAGNOSIS — E1165 Type 2 diabetes mellitus with hyperglycemia: Secondary | ICD-10-CM | POA: Diagnosis not present

## 2023-04-28 DIAGNOSIS — E1165 Type 2 diabetes mellitus with hyperglycemia: Secondary | ICD-10-CM | POA: Diagnosis not present

## 2023-04-28 DIAGNOSIS — N3281 Overactive bladder: Secondary | ICD-10-CM | POA: Diagnosis not present

## 2023-04-28 DIAGNOSIS — Z0001 Encounter for general adult medical examination with abnormal findings: Secondary | ICD-10-CM | POA: Diagnosis not present

## 2023-04-28 DIAGNOSIS — R109 Unspecified abdominal pain: Secondary | ICD-10-CM | POA: Diagnosis not present

## 2023-04-28 DIAGNOSIS — E782 Mixed hyperlipidemia: Secondary | ICD-10-CM | POA: Diagnosis not present

## 2023-04-28 DIAGNOSIS — N1831 Chronic kidney disease, stage 3a: Secondary | ICD-10-CM | POA: Diagnosis not present

## 2023-04-28 DIAGNOSIS — I129 Hypertensive chronic kidney disease with stage 1 through stage 4 chronic kidney disease, or unspecified chronic kidney disease: Secondary | ICD-10-CM | POA: Diagnosis not present

## 2023-04-28 DIAGNOSIS — R7989 Other specified abnormal findings of blood chemistry: Secondary | ICD-10-CM | POA: Diagnosis not present

## 2023-04-28 DIAGNOSIS — Z0000 Encounter for general adult medical examination without abnormal findings: Secondary | ICD-10-CM | POA: Diagnosis not present

## 2023-04-28 DIAGNOSIS — R600 Localized edema: Secondary | ICD-10-CM | POA: Diagnosis not present

## 2023-04-28 DIAGNOSIS — K5900 Constipation, unspecified: Secondary | ICD-10-CM | POA: Diagnosis not present

## 2023-06-10 DIAGNOSIS — R350 Frequency of micturition: Secondary | ICD-10-CM | POA: Diagnosis not present

## 2023-06-10 DIAGNOSIS — J069 Acute upper respiratory infection, unspecified: Secondary | ICD-10-CM | POA: Diagnosis not present

## 2023-06-10 DIAGNOSIS — Z79899 Other long term (current) drug therapy: Secondary | ICD-10-CM | POA: Diagnosis not present

## 2023-10-21 DIAGNOSIS — E1165 Type 2 diabetes mellitus with hyperglycemia: Secondary | ICD-10-CM | POA: Diagnosis not present

## 2023-10-27 DIAGNOSIS — N390 Urinary tract infection, site not specified: Secondary | ICD-10-CM | POA: Diagnosis not present

## 2023-10-27 DIAGNOSIS — E1165 Type 2 diabetes mellitus with hyperglycemia: Secondary | ICD-10-CM | POA: Diagnosis not present

## 2023-10-27 DIAGNOSIS — R945 Abnormal results of liver function studies: Secondary | ICD-10-CM | POA: Diagnosis not present

## 2023-10-27 DIAGNOSIS — R1013 Epigastric pain: Secondary | ICD-10-CM | POA: Diagnosis not present

## 2023-10-27 DIAGNOSIS — K5900 Constipation, unspecified: Secondary | ICD-10-CM | POA: Diagnosis not present

## 2023-10-27 DIAGNOSIS — R109 Unspecified abdominal pain: Secondary | ICD-10-CM | POA: Diagnosis not present

## 2023-10-27 DIAGNOSIS — R600 Localized edema: Secondary | ICD-10-CM | POA: Diagnosis not present

## 2023-10-27 DIAGNOSIS — E782 Mixed hyperlipidemia: Secondary | ICD-10-CM | POA: Diagnosis not present

## 2023-10-27 DIAGNOSIS — I129 Hypertensive chronic kidney disease with stage 1 through stage 4 chronic kidney disease, or unspecified chronic kidney disease: Secondary | ICD-10-CM | POA: Diagnosis not present

## 2023-10-27 DIAGNOSIS — N1831 Chronic kidney disease, stage 3a: Secondary | ICD-10-CM | POA: Diagnosis not present

## 2023-10-27 DIAGNOSIS — N3281 Overactive bladder: Secondary | ICD-10-CM | POA: Diagnosis not present

## 2024-04-20 DIAGNOSIS — E1165 Type 2 diabetes mellitus with hyperglycemia: Secondary | ICD-10-CM | POA: Diagnosis not present

## 2024-04-26 DIAGNOSIS — R1013 Epigastric pain: Secondary | ICD-10-CM | POA: Diagnosis not present

## 2024-04-26 DIAGNOSIS — K5900 Constipation, unspecified: Secondary | ICD-10-CM | POA: Diagnosis not present

## 2024-04-26 DIAGNOSIS — R945 Abnormal results of liver function studies: Secondary | ICD-10-CM | POA: Diagnosis not present

## 2024-04-26 DIAGNOSIS — E782 Mixed hyperlipidemia: Secondary | ICD-10-CM | POA: Diagnosis not present

## 2024-04-26 DIAGNOSIS — R600 Localized edema: Secondary | ICD-10-CM | POA: Diagnosis not present

## 2024-04-26 DIAGNOSIS — R109 Unspecified abdominal pain: Secondary | ICD-10-CM | POA: Diagnosis not present

## 2024-04-26 DIAGNOSIS — I129 Hypertensive chronic kidney disease with stage 1 through stage 4 chronic kidney disease, or unspecified chronic kidney disease: Secondary | ICD-10-CM | POA: Diagnosis not present

## 2024-04-26 DIAGNOSIS — N3281 Overactive bladder: Secondary | ICD-10-CM | POA: Diagnosis not present

## 2024-04-26 DIAGNOSIS — E876 Hypokalemia: Secondary | ICD-10-CM | POA: Diagnosis not present

## 2024-04-26 DIAGNOSIS — N1831 Chronic kidney disease, stage 3a: Secondary | ICD-10-CM | POA: Diagnosis not present

## 2024-04-26 DIAGNOSIS — E1165 Type 2 diabetes mellitus with hyperglycemia: Secondary | ICD-10-CM | POA: Diagnosis not present

## 2024-05-17 DIAGNOSIS — E1165 Type 2 diabetes mellitus with hyperglycemia: Secondary | ICD-10-CM | POA: Diagnosis not present

## 2024-05-17 DIAGNOSIS — Z713 Dietary counseling and surveillance: Secondary | ICD-10-CM | POA: Diagnosis not present

## 2024-05-17 DIAGNOSIS — Z794 Long term (current) use of insulin: Secondary | ICD-10-CM | POA: Diagnosis not present

## 2024-05-17 DIAGNOSIS — E118 Type 2 diabetes mellitus with unspecified complications: Secondary | ICD-10-CM | POA: Diagnosis not present

## 2024-11-02 ENCOUNTER — Other Ambulatory Visit (HOSPITAL_COMMUNITY): Payer: Self-pay

## 2024-11-02 DIAGNOSIS — N644 Mastodynia: Secondary | ICD-10-CM

## 2024-11-11 ENCOUNTER — Other Ambulatory Visit (HOSPITAL_COMMUNITY): Payer: Self-pay

## 2024-11-11 ENCOUNTER — Ambulatory Visit (HOSPITAL_COMMUNITY)
Admission: RE | Admit: 2024-11-11 | Discharge: 2024-11-11 | Disposition: A | Discharge status: Home / Self Care | Source: Ambulatory Visit

## 2024-11-11 ENCOUNTER — Encounter (HOSPITAL_COMMUNITY): Payer: Self-pay

## 2024-11-11 DIAGNOSIS — N644 Mastodynia: Secondary | ICD-10-CM | POA: Diagnosis present

## 2024-11-11 DIAGNOSIS — R928 Other abnormal and inconclusive findings on diagnostic imaging of breast: Secondary | ICD-10-CM

## 2024-11-30 ENCOUNTER — Ambulatory Visit (HOSPITAL_COMMUNITY)
